# Patient Record
Sex: Female | Born: 1940 | Race: White | Hispanic: No | Smoking: Never smoker
Health system: Southern US, Community
[De-identification: ages and names within clinical notes are randomized; demographics above are authoritative.]

## PROBLEM LIST (undated history)

## (undated) DIAGNOSIS — R87619 Unspecified abnormal cytological findings in specimens from cervix uteri: Secondary | ICD-10-CM

## (undated) DIAGNOSIS — E785 Hyperlipidemia, unspecified: Secondary | ICD-10-CM

## (undated) DIAGNOSIS — K4090 Unilateral inguinal hernia, without obstruction or gangrene, not specified as recurrent: Secondary | ICD-10-CM

## (undated) DIAGNOSIS — F32A Depression, unspecified: Secondary | ICD-10-CM

## (undated) DIAGNOSIS — C801 Malignant (primary) neoplasm, unspecified: Secondary | ICD-10-CM

## (undated) DIAGNOSIS — D126 Benign neoplasm of colon, unspecified: Secondary | ICD-10-CM

## (undated) DIAGNOSIS — F319 Bipolar disorder, unspecified: Secondary | ICD-10-CM

## (undated) DIAGNOSIS — F419 Anxiety disorder, unspecified: Secondary | ICD-10-CM

## (undated) DIAGNOSIS — M1990 Unspecified osteoarthritis, unspecified site: Secondary | ICD-10-CM

## (undated) DIAGNOSIS — F329 Major depressive disorder, single episode, unspecified: Secondary | ICD-10-CM

## (undated) DIAGNOSIS — Z9889 Other specified postprocedural states: Secondary | ICD-10-CM

## (undated) DIAGNOSIS — S8290XA Unspecified fracture of unspecified lower leg, initial encounter for closed fracture: Secondary | ICD-10-CM

## (undated) DIAGNOSIS — G4733 Obstructive sleep apnea (adult) (pediatric): Secondary | ICD-10-CM

## (undated) HISTORY — DX: Depression, unspecified: F32.A

## (undated) HISTORY — DX: Major depressive disorder, single episode, unspecified: F32.9

## (undated) HISTORY — DX: Unilateral inguinal hernia, without obstruction or gangrene, not specified as recurrent: K40.90

## (undated) HISTORY — DX: Hyperlipidemia, unspecified: E78.5

## (undated) HISTORY — DX: Unspecified osteoarthritis, unspecified site: M19.90

## (undated) HISTORY — DX: Anxiety disorder, unspecified: F41.9

## (undated) HISTORY — DX: Bipolar disorder, unspecified: F31.9

## (undated) HISTORY — DX: Unspecified fracture of unspecified lower leg, initial encounter for closed fracture: S82.90XA

## (undated) HISTORY — DX: Malignant (primary) neoplasm, unspecified: C80.1

## (undated) HISTORY — DX: Unspecified abnormal cytological findings in specimens from cervix uteri: R87.619

## (undated) HISTORY — DX: Obstructive sleep apnea (adult) (pediatric): G47.33

## (undated) HISTORY — DX: Benign neoplasm of colon, unspecified: D12.6

## (undated) HISTORY — DX: Other specified postprocedural states: Z98.890

---

## 1977-04-07 DIAGNOSIS — C801 Malignant (primary) neoplasm, unspecified: Secondary | ICD-10-CM

## 1977-04-07 HISTORY — PX: RADICAL HYSTERECTOMY: SHX2283

## 1977-04-07 HISTORY — DX: Malignant (primary) neoplasm, unspecified: C80.1

## 1989-04-07 DIAGNOSIS — S8290XA Unspecified fracture of unspecified lower leg, initial encounter for closed fracture: Secondary | ICD-10-CM

## 1989-04-07 HISTORY — DX: Unspecified fracture of unspecified lower leg, initial encounter for closed fracture: S82.90XA

## 2000-03-13 ENCOUNTER — Ambulatory Visit (HOSPITAL_COMMUNITY): Admission: RE | Admit: 2000-03-13 | Discharge: 2000-03-13 | Payer: Self-pay | Admitting: Gastroenterology

## 2000-03-13 DIAGNOSIS — Z9889 Other specified postprocedural states: Secondary | ICD-10-CM

## 2000-03-13 HISTORY — DX: Other specified postprocedural states: Z98.890

## 2003-05-11 ENCOUNTER — Emergency Department (HOSPITAL_COMMUNITY): Admission: EM | Admit: 2003-05-11 | Discharge: 2003-05-11 | Payer: Self-pay | Admitting: Emergency Medicine

## 2003-07-29 ENCOUNTER — Emergency Department (HOSPITAL_COMMUNITY): Admission: EM | Admit: 2003-07-29 | Discharge: 2003-07-29 | Payer: Self-pay | Admitting: Family Medicine

## 2009-09-24 ENCOUNTER — Ambulatory Visit (HOSPITAL_BASED_OUTPATIENT_CLINIC_OR_DEPARTMENT_OTHER): Admission: RE | Admit: 2009-09-24 | Discharge: 2009-09-24 | Payer: Self-pay | Admitting: Family Medicine

## 2009-09-24 DIAGNOSIS — G4733 Obstructive sleep apnea (adult) (pediatric): Secondary | ICD-10-CM

## 2009-09-24 HISTORY — DX: Obstructive sleep apnea (adult) (pediatric): G47.33

## 2009-09-29 ENCOUNTER — Ambulatory Visit: Payer: Self-pay | Admitting: Internal Medicine

## 2010-05-25 ENCOUNTER — Inpatient Hospital Stay (INDEPENDENT_AMBULATORY_CARE_PROVIDER_SITE_OTHER)
Admission: RE | Admit: 2010-05-25 | Discharge: 2010-05-25 | Disposition: A | Discharge status: Home / Self Care | Payer: Medicare Other | Source: Ambulatory Visit | Attending: Family Medicine | Admitting: Family Medicine

## 2010-05-25 DIAGNOSIS — E876 Hypokalemia: Secondary | ICD-10-CM

## 2010-05-25 DIAGNOSIS — I10 Essential (primary) hypertension: Secondary | ICD-10-CM

## 2010-05-25 DIAGNOSIS — T50995A Adverse effect of other drugs, medicaments and biological substances, initial encounter: Secondary | ICD-10-CM

## 2010-05-25 DIAGNOSIS — N289 Disorder of kidney and ureter, unspecified: Secondary | ICD-10-CM

## 2010-05-25 LAB — POCT URINALYSIS DIPSTICK
Nitrite: NEGATIVE
Protein, ur: 100 mg/dL — AB
Specific Gravity, Urine: 1.015 (ref 1.005–1.030)
Urine Glucose, Fasting: 100 mg/dL — AB
Urobilinogen, UA: 1 mg/dL (ref 0.0–1.0)
pH: 5 (ref 5.0–8.0)

## 2010-05-25 LAB — POCT I-STAT, CHEM 8
BUN: 17 mg/dL (ref 6–23)
Calcium, Ion: 1.18 mmol/L (ref 1.12–1.32)
Chloride: 105 mEq/L (ref 96–112)
Creatinine, Ser: 1.4 mg/dL — ABNORMAL HIGH (ref 0.4–1.2)
Glucose, Bld: 129 mg/dL — ABNORMAL HIGH (ref 70–99)
HCT: 41 % (ref 36.0–46.0)
Hemoglobin: 13.9 g/dL (ref 12.0–15.0)
Potassium: 3.2 mEq/L — ABNORMAL LOW (ref 3.5–5.1)
Sodium: 134 mEq/L — ABNORMAL LOW (ref 135–145)
TCO2: 19 mmol/L (ref 0–100)

## 2010-07-08 ENCOUNTER — Ambulatory Visit (INDEPENDENT_AMBULATORY_CARE_PROVIDER_SITE_OTHER): Payer: Medicare Other | Admitting: Internal Medicine

## 2010-07-08 ENCOUNTER — Encounter: Payer: Self-pay | Admitting: Internal Medicine

## 2010-07-08 DIAGNOSIS — E785 Hyperlipidemia, unspecified: Secondary | ICD-10-CM | POA: Insufficient documentation

## 2010-07-08 DIAGNOSIS — R109 Unspecified abdominal pain: Secondary | ICD-10-CM

## 2010-07-08 DIAGNOSIS — G4733 Obstructive sleep apnea (adult) (pediatric): Secondary | ICD-10-CM

## 2010-07-08 DIAGNOSIS — M1990 Unspecified osteoarthritis, unspecified site: Secondary | ICD-10-CM

## 2010-07-08 DIAGNOSIS — E039 Hypothyroidism, unspecified: Secondary | ICD-10-CM

## 2010-07-08 DIAGNOSIS — F319 Bipolar disorder, unspecified: Secondary | ICD-10-CM

## 2010-07-08 MED ORDER — DICYCLOMINE HCL 10 MG PO CAPS: 10.0000 mg | ORAL_CAPSULE | Freq: Four times a day (QID) | ORAL | Status: DC

## 2010-07-08 NOTE — Assessment & Plan Note (Addendum)
Ill-defined abdominal pain for 2 years, recent CT of the abdomen negative except for non-specific mesenteric lymphadenopathies. Chart is reviewed, had a normal colonoscopy in 2001. IBS? Plan: Trial with bentyl GI referal, for pain eval and consideration of a cscope .... +FH colon ca (GM per Pt) Reassess in one month

## 2010-07-08 NOTE — Patient Instructions (Signed)
Try Bentyl as needed for abdominal pain Align, it is a over-the-counter "good bacteria." One daily for a month Get records and labs from your  previous physician

## 2010-07-08 NOTE — Progress Notes (Signed)
  Subjective:    Patient ID: Laura Drake, female    DOB: [birth year], 70 y.o.   MRN: [id number]  HPI New pt, to get stablish  Her main concern today is a 2 year history of an ill-defined lower abdominal discomfort, on and off. She saw her urologist for that because she also had a UTI, CT of the abdomen few days ago showed inguinal hernia, some nonspecific mesenteric lymphadenopathies but otherwise no explanation for her discomfort. She also has halythosis, status post evaluation by her dentist, no dental etiology  found  Past Medical History  Diagnosis Date  . DJD (degenerative joint disease)        . Depression     h/o suicidal attempt 58  . Hyperlipidemia   . Bipolar affective disorder     dx at age 35   . Glaucoma   . Hypothyroidism   . Hernia, inguinal     per CT 3-12  . S/P colonoscopy 03-13-2000    hemorrhoids, tortous sigmoid, Dr Ewing Schlein  . OSA (obstructive sleep apnea) 09-24-09    + sleep study, "moderate"   Past Surgical History  Procedure Date  . Radical hysterectomy 1999   History   Social History  . Marital Status: Divorced    Spouse Name: N/A    Number of Children: N/A  . Years of Education: N/A   Occupational History  . Retired    Social History Main Topics  . Smoking status: Never Smoker   . Smokeless tobacco: Never Used  . Alcohol Use: 0.0 oz/week    0.2 drink(s) per week     1/monthly- wine or whisky sour  . Drug Use: Not on file  . Sexually Active: Not on file   Other Topics Concern  . Not on file   Social History Narrative   WidowChildren x 3 Daughter is [name] , one of my ptLives by herselfRetired      Review of Systems No fevers No nausea, vomiting, diarrhea No blood in the stools Nor recent weight loss     Objective:   Physical Exam  Constitutional: She appears well-developed and well-nourished.  Cardiovascular: Normal rate, regular rhythm and normal heart sounds.   Pulmonary/Chest: Effort normal and breath sounds  normal.  Abdominal: Soft. Bowel sounds are normal. She exhibits no distension. There is no tenderness. There is no rebound and no guarding.  Musculoskeletal: She exhibits no edema.  Psychiatric: She has a normal mood and affect. Her behavior is normal. Judgment and thought content normal.          Assessment & Plan:

## 2010-07-08 NOTE — Assessment & Plan Note (Signed)
Recently had severe knee pain, status post a local injection which helped.

## 2010-08-23 ENCOUNTER — Other Ambulatory Visit: Payer: Self-pay | Admitting: Internal Medicine

## 2010-08-23 NOTE — Procedures (Signed)
[facility]  Patient:    Laura Drake, Laura Drake                       MRN: [id number] Proc. Date: 03/13/00 Adm. Date:  [id number] Attending:  Nelda Marseille CC:         Feliciana Rossetti, M.D.   Procedure Report  PROCEDURE:  Colonoscopy.  SURGEON:  Petra Kuba, M.D.  INDICATIONS:  Family history of colon cancer, chronic constipation, and due for colonic screening.  Consent was signed after risks, benefits, methods, and options were thoroughly discussed in the office.  MEDICINES USED:  Demerol 70 and Versed 7.  DESCRIPTION OF PROCEDURE:  Rectal inspection was pertinent for external hemorrhoids.  Digital exam was negative.  The video colonoscope was inserted and with mild difficulty due to a tortuous sigmoid and tortuous ______ were able to be advanced to the cecum.  This did require rolling her on her back to multiple various abdominal pressures.  The cecum was identified by the appendiceal orifice and the ileocecal valve.  No obvious abnormality was seen on insertion.  Lots of washing and suctioning was done on insertion as well as on withdrawal.  Prep in the cecum and the majority of the ascending was only poor due to stool being stuck to the wall and unable to be washed and suctioned off.  The rest of the prep was adequate.  No obvious abnormality was seen.  The scope was slowly withdrawn.  When we fell back around a tortuous loop, we did try to readvance around those curves.  No polypoid lesions, masses, or other abnormalities were seen.  Once back in the rectum, the scope was retroflexed and pertinent for some internal hemorrhoids.  Anorectal pull through confirmed the above.  The scope was straightened, air was suctioned, and the scope removed.  The patient tolerated the procedure adequately and there was no obvious or immediate complication.  ENDOSCOPIC DIAGNOSES: 1. Internal and external hemorrhoids, small. 2. Tortuous sigmoid on flexion. 3.  Adequate preparation on the left side, but a poor preparation in the cecum    and the ascending. 4. Otherwise within normal limits to the cecum.  PLAN:  Yearly rectals and guaiacs per Dr. Quintella Reichert.  Will _____ the crystalloids q.d., prescription written.  GI follow up p.r.n.  Otherwise, repeat screening in five years unless needed sooner p.r.n., and would use GoLYTELY on repeat screening. DD:  03/13/00 TD:  03/14/00 Job: [id number]

## 2010-08-26 NOTE — Telephone Encounter (Signed)
Ok 30, no RF Needs OV

## 2010-10-28 ENCOUNTER — Encounter: Payer: Self-pay | Admitting: Internal Medicine

## 2010-10-28 ENCOUNTER — Ambulatory Visit (INDEPENDENT_AMBULATORY_CARE_PROVIDER_SITE_OTHER): Payer: Medicare Other | Admitting: Internal Medicine

## 2010-10-28 DIAGNOSIS — E039 Hypothyroidism, unspecified: Secondary | ICD-10-CM

## 2010-10-28 DIAGNOSIS — R739 Hyperglycemia, unspecified: Secondary | ICD-10-CM

## 2010-10-28 DIAGNOSIS — F319 Bipolar disorder, unspecified: Secondary | ICD-10-CM

## 2010-10-28 DIAGNOSIS — R7309 Other abnormal glucose: Secondary | ICD-10-CM

## 2010-10-28 DIAGNOSIS — E785 Hyperlipidemia, unspecified: Secondary | ICD-10-CM

## 2010-10-28 NOTE — Patient Instructions (Signed)
Please come back fasting tomorrow: Hemoglobin A1c, BMP--- dx  hyperglycemia Fasting lipid profile --- dx hyperlipidemia Lithium level --- dx bipolar TSH --- dx hypothyroidism Please reschedule your appointment to see the stomach doctor. Please call today or tomorrow Dr. Madaline Guthrie and let them know that your depression is not well controlled. If your suicidal thoughts are severe, you need to call them immediately

## 2010-10-28 NOTE — Assessment & Plan Note (Signed)
On diet only, labs 

## 2010-10-28 NOTE — Assessment & Plan Note (Addendum)
reports a history of hyperglycemia, check A1c and a BMP

## 2010-10-28 NOTE — Assessment & Plan Note (Signed)
Due for labs

## 2010-10-28 NOTE — Assessment & Plan Note (Addendum)
Will check a lithium level fax to Dr. Madaline Guthrie. I'm somehow concerned about his suicidal thought she had from time to time, reports that she had that for years, they are not severe, she has no plans.

## 2010-10-28 NOTE — Progress Notes (Signed)
  Subjective:    Patient ID: Laura Drake, female    DOB: 1940-04-22, 70 y.o.   MRN: [id number]  HPI Followup from previous visit.  Past Medical History  Diagnosis Date  . DJD (degenerative joint disease)        . Depression     h/o suicidal attempt 71  . Hyperlipidemia   . Bipolar affective disorder     dx at age 34   . Glaucoma   . Hypothyroidism   . Hernia, inguinal     per CT 3-12  . S/P colonoscopy 03-13-2000    hemorrhoids, tortous sigmoid, Dr Ewing Schlein  . OSA (obstructive sleep apnea) 09-24-09    + sleep study, "moderate"   Past Surgical History  Procedure Date  . Radical hysterectomy 1999     Review of Systems abdominal pain--she took align and bentyl as prescribed. While on bentyl she thinks the pain was worse. She had to cancel her GI appointment but plans to reschedule. Likes the following labs done: FLP A1C b/c  in the past she was told she had hyperglycemia, never required medication. Also needs her lithium level checked Her bipolar sx are not as well control as she would like, she has been feeling more depressed in the last few months. When asked about suicide, the thought has crossed her mind, reports that is not unusual for her but she wouldn't do it because she has 3 g-kids.     Objective:   Physical Exam  Constitutional: She is oriented to person, place, and time. She appears well-developed and well-nourished. No distress.  Abdominal: Soft. Bowel sounds are normal. She exhibits no distension. There is no tenderness. There is no rebound and no guarding.  Neurological: She is alert and oriented to person, place, and time.  Skin: She is not diaphoretic.  Psychiatric: She has a normal mood and affect. Her behavior is normal. Judgment and thought content normal.          Assessment & Plan:

## 2010-10-29 ENCOUNTER — Other Ambulatory Visit (INDEPENDENT_AMBULATORY_CARE_PROVIDER_SITE_OTHER): Payer: Medicare Other

## 2010-10-29 DIAGNOSIS — E119 Type 2 diabetes mellitus without complications: Secondary | ICD-10-CM

## 2010-10-29 DIAGNOSIS — E785 Hyperlipidemia, unspecified: Secondary | ICD-10-CM

## 2010-10-29 DIAGNOSIS — E039 Hypothyroidism, unspecified: Secondary | ICD-10-CM

## 2010-10-29 LAB — BASIC METABOLIC PANEL
BUN: 14 mg/dL (ref 6–23)
CO2: 23 mEq/L (ref 19–32)
Calcium: 9.1 mg/dL (ref 8.4–10.5)
Chloride: 111 mEq/L (ref 96–112)
Creatinine, Ser: 1 mg/dL (ref 0.4–1.2)
GFR: 59.24 mL/min — ABNORMAL LOW (ref >60.00)
Glucose, Bld: 118 mg/dL — ABNORMAL HIGH (ref 70–99)
Potassium: 4.1 mEq/L (ref 3.5–5.1)
Sodium: 140 mEq/L (ref 135–145)

## 2010-10-29 LAB — LIPID PANEL
Cholesterol: 192 mg/dL (ref 0–200)
HDL: 54.5 mg/dL (ref >39.00)
LDL Cholesterol: 105 mg/dL — ABNORMAL HIGH (ref 0–99)
Total CHOL/HDL Ratio: 4
Triglycerides: 163 mg/dL — ABNORMAL HIGH (ref 0.0–149.0)
VLDL: 32.6 mg/dL (ref 0.0–40.0)

## 2010-10-29 LAB — HEMOGLOBIN A1C: Hgb A1c MFr Bld: 6.2 % (ref 4.6–6.5)

## 2010-10-29 LAB — TSH: TSH: 1.65 u[IU]/mL (ref 0.35–5.50)

## 2010-10-29 NOTE — Progress Notes (Signed)
Labs only Labs only  

## 2010-10-30 LAB — LITHIUM LEVEL: Lithium Lvl: 0.58 mEq/L — ABNORMAL LOW (ref 0.80–1.40)

## 2010-10-31 ENCOUNTER — Telehealth: Payer: Self-pay | Admitting: *Deleted

## 2010-10-31 NOTE — Telephone Encounter (Signed)
Message copied by Leanne Lovely on Thu Oct 31, 2010  3:34 PM ------      Message from: Willow Ora E      Created: Thu Oct 31, 2010  3:27 PM       Advise patient called      She does have hyperglycemia (borderline diabetes) and the triglycerides are slightly elevated. Both conditions are treated with a healthier diet and daily exercise.      Please fax these results to her psychiatrist Dr. Madaline Guthrie. Ask the patient for the phone number.      Also advise patient to keep her appointment in 4 months

## 2010-10-31 NOTE — Telephone Encounter (Signed)
Pt aware of labs and copy faxed to psych

## 2010-10-31 NOTE — Telephone Encounter (Signed)
Message left for patient to return my call.  

## 2011-01-17 ENCOUNTER — Ambulatory Visit: Payer: Medicare Other | Admitting: Internal Medicine

## 2011-01-17 ENCOUNTER — Encounter: Payer: Self-pay | Admitting: Internal Medicine

## 2011-01-17 ENCOUNTER — Ambulatory Visit (INDEPENDENT_AMBULATORY_CARE_PROVIDER_SITE_OTHER): Payer: Medicare Other | Admitting: Internal Medicine

## 2011-01-17 VITALS — BP 122/72 | HR 85 | Temp 98.3°F | Resp 16 | Ht 63.25 in | Wt 185.2 lb

## 2011-01-17 DIAGNOSIS — Z23 Encounter for immunization: Secondary | ICD-10-CM

## 2011-01-17 DIAGNOSIS — M1710 Unilateral primary osteoarthritis, unspecified knee: Secondary | ICD-10-CM

## 2011-01-17 DIAGNOSIS — R7309 Other abnormal glucose: Secondary | ICD-10-CM

## 2011-01-17 DIAGNOSIS — R5381 Other malaise: Secondary | ICD-10-CM

## 2011-01-17 DIAGNOSIS — M1990 Unspecified osteoarthritis, unspecified site: Secondary | ICD-10-CM

## 2011-01-17 DIAGNOSIS — R5383 Other fatigue: Secondary | ICD-10-CM

## 2011-01-17 DIAGNOSIS — Z0000 Encounter for general adult medical examination without abnormal findings: Secondary | ICD-10-CM

## 2011-01-17 DIAGNOSIS — E039 Hypothyroidism, unspecified: Secondary | ICD-10-CM

## 2011-01-17 DIAGNOSIS — IMO0002 Reserved for concepts with insufficient information to code with codable children: Secondary | ICD-10-CM

## 2011-01-17 DIAGNOSIS — R739 Hyperglycemia, unspecified: Secondary | ICD-10-CM

## 2011-01-17 DIAGNOSIS — R8290 Unspecified abnormal findings in urine: Secondary | ICD-10-CM

## 2011-01-17 DIAGNOSIS — J069 Acute upper respiratory infection, unspecified: Secondary | ICD-10-CM

## 2011-01-17 LAB — POCT URINALYSIS DIPSTICK
Blood, UA: NEGATIVE
Glucose, UA: NEGATIVE
Leukocytes, UA: NEGATIVE
Nitrite, UA: NEGATIVE
Spec Grav, UA: 1.02
Urobilinogen, UA: 0.2
pH, UA: 6

## 2011-01-17 MED ORDER — AZITHROMYCIN 250 MG PO TABS: ORAL_TABLET | ORAL | Status: AC

## 2011-01-17 NOTE — Assessment & Plan Note (Signed)
Patient had several other issues today, she's not fasting. Plan: Reschedule CPX Flu shot today

## 2011-01-17 NOTE — Assessment & Plan Note (Signed)
URI versus allergies, she is nontoxic. See instructions

## 2011-01-17 NOTE — Assessment & Plan Note (Signed)
Hypoglycemia?. Her only symptom was fatigue, she's not taking any diabetes medicine, I doubt she has hypoglycemia. Patient to let me know if symptoms are recurrent

## 2011-01-17 NOTE — Assessment & Plan Note (Addendum)
Exacerbation of knee DJD. Recommend Tylenol, ice and continue using a brace. If not better in few days, recommend to see Dr. Penni Bombard

## 2011-01-17 NOTE — Progress Notes (Signed)
  Subjective:    Patient ID: Laura Drake, female    DOB: 11-18-1940, 70 y.o.   MRN: [id number]  HPI Here with her daughter, supposedly for a complete physical exam has several other issues, we'll do a physical exam next visit. Developed postnasal dripping, cough and raspy voice 2 days ago after she returned from [place]. History of diabetes, reports " low blood sugar" while in [place], did not check her  blood sugar but felt weak and improved after it she ate. Complains of knee pain since she came back from [place], left-sided Complains of bilateral ankle edema since she came back from [place], overall improving at this time. UTI? Some dysuria   Past Medical History  Diagnosis Date  . DJD (degenerative joint disease)   . Depression     h/o suicidal attempt 53  . Hyperlipidemia   . Bipolar affective disorder     dx at age 44   . Glaucoma   . Hypothyroidism   . Hernia, inguinal     per CT 3-12  . S/P colonoscopy 03-13-2000    hemorrhoids, tortous sigmoid, Dr Ewing Schlein  . OSA (obstructive sleep apnea) 09-24-09    + sleep study, "moderate"  . Fracture of leg 1991    L leg, cast x 6 months    Past Surgical History  Procedure Date  . Radical hysterectomy 1999    Review of Systems Denies any chest pain, shortness of breath or palpitations. No fever or chills, no sputum production. No sore throat No chest congestion or wheezing.     Objective:   Physical Exam  Constitutional: She appears well-developed and well-nourished. No distress.  HENT:  Head: Normocephalic and atraumatic.  Right Ear: External ear normal.  Left Ear: External ear normal.  Nose: Nose normal.  Mouth/Throat: No oropharyngeal exudate.       Face symmetric, nontender to palpation  Cardiovascular: Normal rate, regular rhythm and normal heart sounds.   Pulmonary/Chest: Effort normal and breath sounds normal. No respiratory distress. She has no wheezes. She has no rales.  Abdominal: Soft. She exhibits no distension.  There is no tenderness. There is no rebound and no guarding.  Musculoskeletal:       Lower extremities without edema, canals are symmetric and nontender. Both knees had changes consistent with DJD, left knee with a small amount of effusion by physical exam, slightly warm, no redness. Range of motion normal bilaterally.  Skin: She is not diaphoretic.  Psychiatric:       Alert, oriented x3, slightly "hyper" (daughter reports patient has been slightly manic lately)          Assessment & Plan:  UTI? Will send a UCX

## 2011-01-17 NOTE — Assessment & Plan Note (Signed)
Last TSH ok 

## 2011-01-17 NOTE — Patient Instructions (Addendum)
Ice , rest, continue using the knee  brace and take Tylenol for pain, see your orthopedic doctor if the knees not improving soon. Rest, fluids , tylenol For cough, take Mucinex DM twice a day as needed , saline spray, claritine If not better in a few days, start Zithromax, see prescription Call if no better in 2 weeks Call anytime if the symptoms are severe, you have high fever, short of breath, persistent chest pain

## 2011-01-22 LAB — CULTURE, URINE COMPREHENSIVE: Colony Count: 55000

## 2011-01-24 ENCOUNTER — Telehealth: Payer: Self-pay | Admitting: *Deleted

## 2011-01-24 MED ORDER — AMOXICILLIN 500 MG PO CAPS: 1000.0000 mg | ORAL_CAPSULE | Freq: Two times a day (BID) | ORAL | Status: AC

## 2011-01-24 NOTE — Telephone Encounter (Signed)
Patient informed Rx sent to pharmacy.

## 2011-01-24 NOTE — Telephone Encounter (Signed)
Message copied by Regis Bill on Fri Jan 24, 2011  4:31 PM ------      Message from: Laura Drake      Created: Thu Jan 23, 2011  5:27 PM       Patient complain of dysuria thus will treat.      Call in amoxicillin 500 mg 2 by mouth twice a day for 5 days #20, no RF

## 2011-02-21 ENCOUNTER — Telehealth: Payer: Self-pay | Admitting: Internal Medicine

## 2011-02-21 NOTE — Telephone Encounter (Signed)
Pt was suppose to schedule a CPX with Dr. Drue Novel for this month. She should come in fasting and can discuss the GI referral at that time. Please call pt to schedule physical

## 2011-02-21 NOTE — Telephone Encounter (Signed)
Patient discussed scheduling a colonoscopy with dr Drue Novel - she was going to schedule but the dr retired - wants dr Drue Novel to refer to [facility]

## 2011-02-25 NOTE — Telephone Encounter (Signed)
appt for  scheduled [id number] --- cpx scheduled [phone]

## 2011-03-07 ENCOUNTER — Ambulatory Visit: Payer: Medicare Other | Admitting: Internal Medicine

## 2011-03-19 ENCOUNTER — Encounter: Payer: Self-pay | Admitting: Internal Medicine

## 2011-03-19 ENCOUNTER — Ambulatory Visit (INDEPENDENT_AMBULATORY_CARE_PROVIDER_SITE_OTHER): Payer: Medicare Other | Admitting: Internal Medicine

## 2011-03-19 DIAGNOSIS — M1990 Unspecified osteoarthritis, unspecified site: Secondary | ICD-10-CM

## 2011-03-19 DIAGNOSIS — R739 Hyperglycemia, unspecified: Secondary | ICD-10-CM

## 2011-03-19 DIAGNOSIS — F319 Bipolar disorder, unspecified: Secondary | ICD-10-CM

## 2011-03-19 DIAGNOSIS — E039 Hypothyroidism, unspecified: Secondary | ICD-10-CM

## 2011-03-19 DIAGNOSIS — E785 Hyperlipidemia, unspecified: Secondary | ICD-10-CM

## 2011-03-19 DIAGNOSIS — Z0000 Encounter for general adult medical examination without abnormal findings: Secondary | ICD-10-CM

## 2011-03-19 DIAGNOSIS — R7309 Other abnormal glucose: Secondary | ICD-10-CM

## 2011-03-19 DIAGNOSIS — Z13820 Encounter for screening for osteoporosis: Secondary | ICD-10-CM

## 2011-03-19 NOTE — Assessment & Plan Note (Addendum)
Going through some depression, no suicidal, reports she has these episodes on and on for many years. I recommended to call her psychiatrist.

## 2011-03-19 NOTE — Assessment & Plan Note (Signed)
On diet only, labs 

## 2011-03-19 NOTE — Progress Notes (Signed)
Subjective:    Patient ID: Laura Drake, female    DOB: 1941/03/07, 70 y.o.   MRN: [id number]  HPI Here for Medicare AWV: 1. Risk factors based on Past M, S, F history: reviewed 2. Physical Activities: very active at home most of the time    3. Depression/mood: currently going to a depress state d/t bipolar, no suicidal  4. Hearing: reports is normal, no tinnitus   5. ADL's: independent, still drives   6. Fall Risk: no recent falls  7. home Safety: does feel safe at home  8. Height, weight, &visual acuity: see VS, h/o glaucoma, sees eye doctor regularly  9. Counseling: provided 10. Labs ordered based on risk factors: if needed  11. Referral Coordination: if needed 12.  Care Plan, see assessment and plan  13.   Cognitive Assessment: motor skills and cognition are appropriate   In addition, today we discussed the following: Saw gyn, Dr Laura Drake recently, to have a u/s ; they did a stool sample  Colonoscopy-- needs referral Bipolar-- per psych, was rec to increase lithium dose.  Hypothyroid-- good med compliance  DJD-- on ibuprofen, 4 OTCs a day, relatively well control; no apparent GI s/e   Past Medical History  Diagnosis Date  . DJD (degenerative joint disease)   . Depression     h/o suicidal attempt 5  . Hyperlipidemia   . Bipolar affective disorder     dx at age 73   . Glaucoma   . Hypothyroidism   . Hernia, inguinal     per CT 3-12  . S/P colonoscopy 03-13-2000    hemorrhoids, tortous sigmoid, Dr Laura Drake  . OSA (obstructive sleep apnea) 09-24-09    + sleep study, "moderate"  . Fracture of leg 1991    L leg, cast x 6 months     Past Surgical History  Procedure Date  . Radical hysterectomy 1999   History   Social History  . Marital Status: Divorced    Spouse Name: N/A    Number of Children: N/A  . Years of Education: N/A   Occupational History  . Retired    Social History Main Topics  . Smoking status: Never Smoker   . Smokeless tobacco: Never Used  .  Alcohol Use: 0.0 oz/week    0.2 drink(s) per week     1/monthly- wine or whisky sour  . Drug Use: Not on file  . Sexually Active: Not on file   Other Topics Concern  . Not on file   Social History Narrative   WidowChildren x 3 Daughter is Laura Drake , one of my ptLives by herselfRetired    Family History  Problem Relation Age of Onset  . Bipolar disorder Father   . Diabetes Sister   . Colon cancer Paternal Grandmother   . Parkinsonism Mother    Review of Systems  Constitutional: Negative for fever.  Cardiovascular: Negative for chest pain and leg swelling.  Gastrointestinal: Negative for abdominal pain and blood in stool.       Chronic constipation since childhood   Genitourinary: Negative for hematuria and difficulty urinating.       Objective:   Physical Exam  Constitutional: She is oriented to person, place, and time. She appears well-developed. No distress.  HENT:  Head: Normocephalic and atraumatic.  Neck: No thyromegaly present.       Normal carotid pulse  Cardiovascular: Normal rate, regular rhythm and normal heart sounds.   No murmur heard. Pulmonary/Chest: Effort normal and breath  sounds normal. No respiratory distress. She has no wheezes. She has no rales.  Abdominal: Soft. Bowel sounds are normal. She exhibits no distension. There is no tenderness. There is no rebound and no guarding.       No bruit   Musculoskeletal: She exhibits no edema.  Neurological: She is alert and oriented to person, place, and time.  Skin: Skin is warm and dry. She is not diaphoretic.  Psychiatric:       She gets tearful at times, she is however coherent and cooperative            Assessment & Plan:

## 2011-03-19 NOTE — Assessment & Plan Note (Signed)
takes 4 ibuprofen a day, no apparent side effects. No change for now. Warned about PUD sx

## 2011-03-19 NOTE — Patient Instructions (Addendum)
Please call your psychiatrist if your symptoms get worse or you are not better in 2 days Continue watching your diet and try to exercise daily. Ibuprofen make cause stomach irritation, nausea, abdominal pain.If you  have any of those symptoms----> stop ibuprofen and let me know. You may like to tr Tylenol for arthritis

## 2011-03-19 NOTE — Assessment & Plan Note (Addendum)
Td 09 Had a flu shot already Unclear if she had Pneumonia shot already, reassess next year Shingles shot discussed  Cscope 2001, Dr Ewing Schlein, likes to transfer to [place]: referral done  Just  met w/ her new  Gynecologist, they did a breast exam Will refer for a MMG Last DEXA ~ 2002, normal----will order one today   Diet, exercise discussed  EKG (baseline) normal sinus rhythm, no acute changes

## 2011-03-19 NOTE — Assessment & Plan Note (Signed)
Labs

## 2011-03-19 NOTE — Assessment & Plan Note (Addendum)
A1c 6.2, 10-2010 Labs Diet and exercise discussed

## 2011-03-20 LAB — CBC WITH DIFFERENTIAL/PLATELET
Basophils Absolute: 0.1 10*3/uL (ref 0.0–0.1)
Basophils Relative: 0.5 % (ref 0.0–3.0)
Eosinophils Absolute: 0.2 10*3/uL (ref 0.0–0.7)
Eosinophils Relative: 1.8 % (ref 0.0–5.0)
HCT: 41.8 % (ref 36.0–46.0)
Hemoglobin: 14.2 g/dL (ref 12.0–15.0)
Lymphocytes Relative: 38.9 % (ref 12.0–46.0)
Lymphs Abs: 4 10*3/uL (ref 0.7–4.0)
MCHC: 34 g/dL (ref 30.0–36.0)
MCV: 92 fl (ref 78.0–100.0)
Monocytes Absolute: 1 10*3/uL (ref 0.1–1.0)
Monocytes Relative: 9.3 % (ref 3.0–12.0)
Neutro Abs: 5.1 10*3/uL (ref 1.4–7.7)
Neutrophils Relative %: 49.5 % (ref 43.0–77.0)
Platelets: 344 10*3/uL (ref 150.0–400.0)
RBC: 4.55 Mil/uL (ref 3.87–5.11)
RDW: 14.6 % (ref 11.5–14.6)
WBC: 10.3 10*3/uL (ref 4.5–10.5)

## 2011-03-20 LAB — TSH: TSH: 2.96 u[IU]/mL (ref 0.35–5.50)

## 2011-03-20 LAB — LIPID PANEL
Cholesterol: 204 mg/dL — ABNORMAL HIGH (ref 0–200)
HDL: 63.1 mg/dL (ref >39.00)
Total CHOL/HDL Ratio: 3
Triglycerides: 138 mg/dL (ref 0.0–149.0)
VLDL: 27.6 mg/dL (ref 0.0–40.0)

## 2011-03-20 LAB — LDL CHOLESTEROL, DIRECT: Direct LDL: 109.6 mg/dL

## 2011-03-20 LAB — HEMOGLOBIN A1C: Hgb A1c MFr Bld: 6.1 % (ref 4.6–6.5)

## 2011-03-21 ENCOUNTER — Encounter: Payer: Self-pay | Admitting: Internal Medicine

## 2011-03-24 ENCOUNTER — Ambulatory Visit (AMBULATORY_SURGERY_CENTER): Payer: Medicare Other | Admitting: *Deleted

## 2011-03-24 ENCOUNTER — Encounter: Payer: Self-pay | Admitting: Internal Medicine

## 2011-03-24 VITALS — Wt 180.0 lb

## 2011-03-24 DIAGNOSIS — Z1211 Encounter for screening for malignant neoplasm of colon: Secondary | ICD-10-CM

## 2011-03-24 NOTE — Progress Notes (Signed)
Pt here today for PV for screening colonoscopy.  Last colonoscopy 2001 w/ Dr. Ewing Schlein.  In PV pt says she is having RUQ and LUQ pain. This pain is sometimes severe, rating 8 on pain scale when it occurs.  She has not had this pain recently.  She also says she has severe constipation, using Miralax 4 times a week.  Pt would like to make an appointment to see Dr. Juanda Chance in the office.  Colonoscopy scheduled for 12/21 cancelled and new patient appointment scheduled for 04/30/2011.  Pt instructed to call office if she has any problems with pain before scheduled office visit. Ezra Sites

## 2011-03-28 ENCOUNTER — Other Ambulatory Visit: Payer: Medicare Other | Admitting: Internal Medicine

## 2011-04-08 DIAGNOSIS — D126 Benign neoplasm of colon, unspecified: Secondary | ICD-10-CM

## 2011-04-08 HISTORY — DX: Benign neoplasm of colon, unspecified: D12.6

## 2011-04-17 ENCOUNTER — Encounter: Payer: Self-pay | Admitting: *Deleted

## 2011-04-30 ENCOUNTER — Ambulatory Visit (INDEPENDENT_AMBULATORY_CARE_PROVIDER_SITE_OTHER): Payer: Medicare Other | Admitting: Internal Medicine

## 2011-04-30 ENCOUNTER — Encounter: Payer: Self-pay | Admitting: Internal Medicine

## 2011-04-30 DIAGNOSIS — R1032 Left lower quadrant pain: Secondary | ICD-10-CM

## 2011-04-30 DIAGNOSIS — R933 Abnormal findings on diagnostic imaging of other parts of digestive tract: Secondary | ICD-10-CM

## 2011-04-30 MED ORDER — PEG-KCL-NACL-NASULF-NA ASC-C 100 G PO SOLR: 1.0000 | Freq: Once | ORAL | Status: DC

## 2011-04-30 NOTE — Progress Notes (Signed)
Laura Drake 1940-10-20 MRN [id number]     History of Present Illness:  This is a 71 year old white female with chronic left lower quadrant abdominal pain localized anteriorly. A CT scan of the abdomen in March 2012 showed numerous mesenteric lymph nodes of normal size. She had a left inguinal hernia with fat content. The pain comes and goes and becomes rather intense at times. She is chronically constipated and takes MiraLax 17 g 4 times a week. She has been evaluated by a gynecologist and by a urologist for this pain. No specific reason has been found. She had a colonoscopy by Dr.Magod in 2001 and was told to have a torturous colon. She has a diagnosis of bipolar disorder treated  with lithium. Her paternal grandmother had colon cancer.    Past Medical History  Diagnosis Date  . DJD (degenerative joint disease)   . Depression     h/o suicidal attempt 89  . Hyperlipidemia   . Bipolar affective disorder     dx at age 21   . Glaucoma   . Hypothyroidism   . Hernia, inguinal     per CT 3-12  . S/P colonoscopy 03-13-2000    hemorrhoids, tortous sigmoid, Dr Ewing Schlein  . OSA (obstructive sleep apnea) 09-24-09    + sleep study, "moderate"  . Fracture of leg 1991    L leg, cast x 6 months   . Hemorrhoids    Past Surgical History  Procedure Date  . Radical hysterectomy 1979    reports that she has never smoked. She has never used smokeless tobacco. She reports that she drinks alcohol. She reports that she does not use illicit drugs. family history includes Bipolar disorder in her father; Colon cancer in her paternal grandmother; Diabetes in her sister; and Parkinsonism in her mother. Allergies  Allergen Reactions  . Other Swelling    Shellfish  . Sulfa Antibiotics         Review of Systems:He denies dysphagia, odynophagia or shortness of breath or chest pain  The remainder of the 10 point ROS is negative except as outlined in H&P   Physical Exam: General appearance  Well  developed, in no distress. Eyes- non icteric. HEENT nontraumatic, normocephalic. Mouth no lesions, tongue papillated, no cheilosis. Neck supple without adenopathy, thyroid not enlarged, no carotid bruits, no JVD. Lungs Clear to auscultation bilaterally. Cor normal S1, normal S2, regular rhythm, no murmur,  quiet precordium. Abdomen: Soft abdomen with normal active bowel sounds. Tenderness along sigmoid colon. No fullness or rebound. No abdominal bruit. Liver edge at costal margin.  Rectal:Normal perianal area. Normal rectal sphincter tone. No stool in the ampulla  Extremities no pedal edema. Skin no lesions. Neurological alert and oriented x 3. Psychological normal mood and affect.  Assessment and Plan:  Problem #1 Chronic left lower quadrant abdominal pain most likely related to her tortorous left colon. This is possibly due to significant diverticulosis of the left colon or even a partial obstruction. This needs to be further evaluated.Her recent pelvic ultrasound was normal. Her last colonoscopy in 2001 confirmed a tortuous colon. We will proceed with a colonoscopy with propofol sedation. I anticipate it may be difficult to negotiate through the left colon. I have told her to use MiraLax and stool softeners to produce at least one bowel movement a day. We may have more recommendations after her colonoscopy.  04/30/2011 [name]

## 2011-04-30 NOTE — Patient Instructions (Addendum)
You have been scheduled for a Colonoscopy with propofol. See separate instructions.  Pick up your prep kit from your pharmacy.  cc: Dr Drue Novel

## 2011-05-01 ENCOUNTER — Telehealth: Payer: Self-pay | Admitting: Internal Medicine

## 2011-05-01 ENCOUNTER — Telehealth: Payer: Self-pay | Admitting: *Deleted

## 2011-05-01 NOTE — Telephone Encounter (Signed)
Patient states that she has been taking over the counter medications for constipation (Miralax and stool softener) and she is in severe pain. She would like to know if there are any medications that we can give her for discomfort while she is awaiting her procedure on 05/13/11. I explained that often, pain medications can cause constipation to get worse but that I would check with Dr Juanda Chance for recommendations. Dr Juanda Chance, please advise.

## 2011-05-01 NOTE — Telephone Encounter (Signed)
Spoke with patient and answered questions.

## 2011-05-01 NOTE — Telephone Encounter (Signed)
.   Please obtain KUB to assess for possible SBO . If OK, then she can takr a bottle of Mag citrate as a laxative

## 2011-05-02 ENCOUNTER — Encounter: Payer: Medicare Other | Admitting: Internal Medicine

## 2011-05-02 ENCOUNTER — Telehealth: Payer: Self-pay | Admitting: *Deleted

## 2011-05-02 ENCOUNTER — Ambulatory Visit (INDEPENDENT_AMBULATORY_CARE_PROVIDER_SITE_OTHER)
Admission: RE | Admit: 2011-05-02 | Discharge: 2011-05-02 | Disposition: A | Discharge status: Home / Self Care | Payer: Medicare Other | Source: Ambulatory Visit | Attending: Internal Medicine | Admitting: Internal Medicine

## 2011-05-02 DIAGNOSIS — K5900 Constipation, unspecified: Secondary | ICD-10-CM

## 2011-05-02 DIAGNOSIS — R109 Unspecified abdominal pain: Secondary | ICD-10-CM

## 2011-05-02 NOTE — Telephone Encounter (Signed)
Patient states that she is unsure that what Dr Juanda Chance has suggested will be helpful for her since she has taken Mag Citrate several times previously without relief. I have advised that Dr Juanda Chance would like a KUB to be completed first anyways and we will go from there once we get results. Patient also states that she needs her colonoscopy procedure much sooner than 05/13/11 because of the pain. I have advised that the first date available for propofol sedation is 05/13/11 but I will continue to look at 05/07/11 for openings. Patient verbalizes understanding and states that she will come today for KUB.

## 2011-05-02 NOTE — Telephone Encounter (Signed)
Patient given results and recommendations per Dr. Juanda Chance

## 2011-05-02 NOTE — Telephone Encounter (Signed)
Message copied by Daphine Deutscher on Fri May 02, 2011  1:17 PM ------      Message from: [place], [place] M      Created: Fri May 02, 2011 12:27 PM       Please call pt with KUB results, no obstruction, moderate stool burden. Please go ahead with Mad citrate, may repeat this weekend.

## 2011-05-09 ENCOUNTER — Telehealth: Payer: Self-pay | Admitting: Internal Medicine

## 2011-05-09 ENCOUNTER — Other Ambulatory Visit: Payer: Self-pay | Admitting: Internal Medicine

## 2011-05-09 NOTE — Telephone Encounter (Signed)
I agree with the management 

## 2011-05-09 NOTE — Telephone Encounter (Signed)
Patient states she was devastated when she had to reschedule her colonoscopy from 05/02/11 to 05/13/11. States she has had pain for 3 weeks and she was excited when she thought she would have the colonoscopy earlier. She is also upset that she had mixed up her Moviprep and is going to have to get another one. When she called the pharmacy they told her it had not been called in. Discussed with patient the reason for moving her colonoscopy for the special anesthesia she needs due to a torturous colon. Also, explained that we have  Moviprep for her here at our office for pick up. Patient understood and felt better. She wants Dr. Juanda Chance to know she is still having pain even after Magnesium Citrate and hopes the colonoscopy will tell what is wrong.

## 2011-05-13 ENCOUNTER — Encounter: Payer: Self-pay | Admitting: Internal Medicine

## 2011-05-13 ENCOUNTER — Telehealth: Payer: Self-pay | Admitting: *Deleted

## 2011-05-13 ENCOUNTER — Ambulatory Visit (AMBULATORY_SURGERY_CENTER): Payer: Medicare Other | Admitting: Internal Medicine

## 2011-05-13 VITALS — BP 146/85 | HR 86 | Temp 98.7°F | Resp 26 | Ht 64.0 in | Wt 188.0 lb

## 2011-05-13 DIAGNOSIS — D126 Benign neoplasm of colon, unspecified: Secondary | ICD-10-CM

## 2011-05-13 DIAGNOSIS — Q438 Other specified congenital malformations of intestine: Secondary | ICD-10-CM

## 2011-05-13 DIAGNOSIS — R1032 Left lower quadrant pain: Secondary | ICD-10-CM

## 2011-05-13 MED ORDER — SODIUM CHLORIDE 0.9 % IV SOLN: 500.0000 mL | INTRAVENOUS | Status: DC

## 2011-05-13 NOTE — Progress Notes (Signed)
Patient did not experience any of the following events: a burn prior to discharge; a fall within the facility; wrong site/side/patient/procedure/implant event; or a hospital transfer or hospital admission upon discharge from the facility. (G8907) Patient did not have preoperative order for IV antibiotic SSI prophylaxis. (G8918)  

## 2011-05-13 NOTE — Patient Instructions (Signed)
Please refer to your blue and neon green sheets for instructions regarding diet and activity for the rest of today.  You may resume your medications as you would normally take them.   Barium Exam scheduled for 05/27/2011 at 10:45am at [facility] Radiology Dept. Please pick up your prep for the exam a few days prior and follow the instructions given to you.  Colon Polyps A polyp is extra tissue that grows inside your body. Colon polyps grow in the large intestine. The large intestine, also called the colon, is part of your digestive system. It is a long, hollow tube at the end of your digestive tract where your body makes and stores stool. Most polyps are not dangerous. They are benign. This means they are not cancerous. But over time, some types of polyps can turn into cancer. Polyps that are smaller than a pea are usually not harmful. But larger polyps could someday become or may already be cancerous. To be safe, doctors remove all polyps and test them.  WHO GETS POLYPS? Anyone can get polyps, but certain people are more likely than others. You may have a greater chance of getting polyps if:  You are over 50.   You have had polyps before.   Someone in your family has had polyps.   Someone in your family has had cancer of the large intestine.   Find out if someone in your family has had polyps. You may also be more likely to get polyps if you:   Eat a lot of fatty foods.   Smoke.   Drink alcohol.   Do not exercise.   Eat too much.  SYMPTOMS  Most small polyps do not cause symptoms. People often do not know they have one until their caregiver finds it during a regular checkup or while testing them for something else. Some people do have symptoms like these:  Bleeding from the anus. You might notice blood on your underwear or on toilet paper after you have had a bowel movement.   Constipation or diarrhea that lasts more than a week.   Blood in the stool. Blood can make stool look  black or it can show up as red streaks in the stool.  If you have any of these symptoms, see your caregiver. HOW DOES THE DOCTOR TEST FOR POLYPS? The doctor can use four tests to check for polyps:  Digital rectal exam. The caregiver wears gloves and checks your rectum (the last part of the large intestine) to see if it feels normal. This test would find polyps only in the rectum. Your caregiver may need to do one of the other tests listed below to find polyps higher up in the intestine.   Barium enema. The caregiver puts a liquid called barium into your rectum before taking x-rays of your large intestine. Barium makes your intestine look white in the pictures. Polyps are dark, so they are easy to see.   Sigmoidoscopy. With this test, the caregiver can see inside your large intestine. A thin flexible tube is placed into your rectum. The device is called a sigmoidoscope, which has a light and a tiny video camera in it. The caregiver uses the sigmoidoscope to look at the last third of your large intestine.   Colonoscopy. This test is like sigmoidoscopy, but the caregiver looks at all of the large intestine. It usually requires sedation. This is the most common method for finding and removing polyps.  TREATMENT   The caregiver will remove  the polyp during sigmoidoscopy or colonoscopy. The polyp is then tested for cancer.   If you have had polyps, your caregiver may want you to get tested regularly in the future.  PREVENTION  There is not one sure way to prevent polyps. You might be able to lower your risk of getting them if you:  Eat more fruits and vegetables and less fatty food.   Do not smoke.   Avoid alcohol.   Exercise every day.   Lose weight if you are overweight.   Eating more calcium and folate can also lower your risk of getting polyps. Some foods that are rich in calcium are milk, cheese, and broccoli. Some foods that are rich in folate are chickpeas, kidney beans, and spinach.     Aspirin might help prevent polyps. Studies are under way.  Document Released: 12/19/2003 Document Revised: 12/04/2010 Document Reviewed: 05/26/2007 [facility] Patient Information 2012 [place], [place].

## 2011-05-13 NOTE — Telephone Encounter (Signed)
Per Dr. Juanda Chance, patient will need barium enema without air. Need to wait 2 weeks due to polyp removal. Incomplete colonoscopy due to redundant colon. Scheduled at [facility] radiology on 05/27/11 10:45/11:00 AM arrival. Will need to pick up prep kit couple days before procedure at [facility] radiology. Alinda Money) Whitney in recovery aware.

## 2011-05-13 NOTE — Op Note (Signed)
[facility] [address] [place], [place]  [address]  COLONOSCOPY PROCEDURE REPORT  PATIENT:  Laura Drake, Laura Drake  MR#:  [id number] BIRTHDATE:  09-07-1940, 70 yrs. old  GENDER:  female ENDOSCOPIST:  Hedwig Morton. Juanda Chance, MD REF. BY:  Willow Ora, M.D. PROCEDURE DATE:  05/13/2011 PROCEDURE:  Incomplete colonoscopy ASA CLASS:  Class II INDICATIONS:  Abdominal pain colon 2001 tortuous colon, now chronic LLQ abdominal pain MEDICATIONS:   MAC sedation, administered by CRNA, propofol (Diprivan) 500 mg  DESCRIPTION OF PROCEDURE:   After the risks and benefits and of the procedure were explained, informed consent was obtained. Digital rectal exam was performed and revealed no rectal masses. The LB [id number]-H180AL [id number] endoscope was introduced through the anus and advanced to the hepatic flexure.  The quality of the prep was good, using MoviPrep.  The instrument was then slowly withdrawn as the colon was fully examined. <<PROCEDUREIMAGES>>  FINDINGS:  incomplete exam. redundant transverse colon, sightly dilated, hypotonic, decreased abdominal wall muscle tone (see image1). Despite of extrinsic pressure and repositioning the scope never reached the cecum  A sessile polyp was found. at 20 cm 5 mm flat poly The polyp was removed using cold biopsy forceps (see image2).  This was otherwise a normal examination of the colon (see image4).   Retroflexed views in the rectum revealed no abnormalities.    The scope was then withdrawn from the patient and the procedure completed.  COMPLICATIONS:  None ENDOSCOPIC IMPRESSION: 1) Incomplete exam 2) Sessile polyp 3) Otherwise normal examination redundant hypotonis dilated colon, no stricture, no significant diverticulosis RECOMMENDATIONS: 1) Await pathology results Barium Enema  REPEAT EXAM:  In 0 year(s) for.  ______________________________ Hedwig Morton. Juanda Chance, MD  CC:  n. eSIGNED:   Hedwig Morton. Auda Finfrock at 05/13/2011 04:06 PM  Ardine Bjork,  [id number]

## 2011-05-14 ENCOUNTER — Telehealth: Payer: Self-pay | Admitting: Internal Medicine

## 2011-05-14 ENCOUNTER — Telehealth: Payer: Self-pay

## 2011-05-14 NOTE — Telephone Encounter (Signed)
Please take mild laxatives to have a BM every day. For instance MOM  3 tablespoons daily, or Mag Oxide pills 2 po qd. We have to wait for the BEnema.

## 2011-05-14 NOTE — Telephone Encounter (Signed)
Spoke with patient and she states she is concerned because she is still having the left sided abdominal pain that comes and goes. She understands that she cannot have the barium enema until 05/27/11 but feels she has been having this off and on pain for long enough. She wants to know if there is anything that can be done for her pain until the barium enema is done. Please, advise.

## 2011-05-14 NOTE — Telephone Encounter (Signed)
Left a message for the pt to call if any questions or concerns #[phone]. maw

## 2011-05-14 NOTE — Telephone Encounter (Signed)
Returned the phone call to the pt.  She told me that she was scheduled at the hospital for a barium enema d/t her incomplete colon yesterday.  Pt asked if she could schedule an appointment with Dr. Juanda Chance after the B.E. Is completed to discuss the finding and her treatment plan.  I called [facility] Cummins and pt's was transfered to make an appointment.  Maw

## 2011-05-15 MED ORDER — MAGNESIUM OXIDE 400 MG PO TABS: ORAL_TABLET | ORAL | Status: DC

## 2011-05-15 NOTE — Telephone Encounter (Signed)
Spoke with patient and gave her recommendations by Dr. Juanda Chance. Rx sent for Magnesium Oxide pills.

## 2011-05-19 ENCOUNTER — Encounter: Payer: Self-pay | Admitting: Internal Medicine

## 2011-05-20 ENCOUNTER — Encounter: Payer: Self-pay | Admitting: *Deleted

## 2011-05-20 ENCOUNTER — Telehealth: Payer: Self-pay | Admitting: Internal Medicine

## 2011-05-20 NOTE — Telephone Encounter (Signed)
Patient calling to report that she started taking the Magnesium tablets 2 po nightly on 05/15/11 alonf with Miralax. She had not had a bowel movement on 05/17/11 so she drank a bottle of Magnesium citrate with no results. On 05/18/11, she had a headache and felt bad so she did an enema. About 1:00 PM, she had small amount of diarrhea. Today, she is having cramping, abdominal pain and had one episode of large amount of diarrhea. She has postponed the barium enema. States she is bipolar and is is a down period since Christmas. States all of the bowel issues are adding to this. She has cancelled her barium enema. States she needs to be in a better physical and mental state to do this. Spoke with Dr. Juanda Chance. Patient to hold Magnesium pills and Miralax today. OV on 05/21/11 3:30/3:45 PM with Dr. Juanda Chance. Patient aware.

## 2011-05-21 ENCOUNTER — Ambulatory Visit (INDEPENDENT_AMBULATORY_CARE_PROVIDER_SITE_OTHER): Payer: Medicare Other | Admitting: Internal Medicine

## 2011-05-21 ENCOUNTER — Encounter: Payer: Self-pay | Admitting: Internal Medicine

## 2011-05-21 DIAGNOSIS — K5989 Other specified functional intestinal disorders: Secondary | ICD-10-CM

## 2011-05-21 DIAGNOSIS — K5900 Constipation, unspecified: Secondary | ICD-10-CM

## 2011-05-21 MED ORDER — TRAMADOL HCL 50 MG PO TABS: ORAL_TABLET | ORAL | Status: DC

## 2011-05-21 NOTE — Patient Instructions (Addendum)
We have sent the following medications to your pharmacy for you to pick up at your convenience: Tramadol CC: Dr Drue Novel, Dr Phillip Heal

## 2011-05-21 NOTE — Progress Notes (Signed)
Laura Drake 24-Nov-1940 MRN [id number]   History of Present Illness:  This is a 71 year old white female with  irregular bowel habits, predominant constipation and redundant tortuous colon which was confirmed  2 weeks ago and on a colonoscopy. The exam was incomplete due to redundancy of her colon. She was scheduled for a barium enema but canceled the procedure because she is in the depression stage of her bipolar disease followed by Laura Drake. She would like to postpone her x-ray until she is feeling better. She has been on recommended magnesium oxide 2 tablets daily, MiraLax 17 g daily. She uses enemas when necessary and magnesium citrate when necessary. She had 2 bowel movements yesterday as well as today. She is feeling fine today without having any distention or abdominal pain.    Past Medical History  Diagnosis Date  . DJD (degenerative joint disease)   . Depression     h/o suicidal attempt 51  . Hyperlipidemia   . Bipolar affective disorder     dx at age 57   . Glaucoma   . Hypothyroidism   . Hernia, inguinal     per CT 3-12  . S/P colonoscopy 03-13-2000    hemorrhoids, tortous sigmoid, Laura Drake  . OSA (obstructive sleep apnea) 09-24-09    + sleep study, "moderate"  . Fracture of leg 1991    L leg, cast x 6 months   . Hemorrhoids   . Adenomatous polyp of colon 2013   Past Surgical History  Procedure Date  . Radical hysterectomy 1979    reports that she has never smoked. She has never used smokeless tobacco. She reports that she drinks alcohol. She reports that she does not use illicit drugs. family history includes Bipolar disorder in her father; Colon cancer in her paternal grandmother; Diabetes in her sister; Other in her sister; and Parkinsonism in her mother. Allergies  Allergen Reactions  . Other Swelling    Shellfish  . Sulfa Antibiotics         Review of Systems:Denies dysphagia, odynophagia or shortness of breath or chest pain  The remainder of the 10  point ROS is negative except as outlined in H&P   Physical Exam: General appearance  Well developed, in no distress. Eyes- non icteric. HEENT nontraumatic, normocephalic. Mouth no lesions, tongue papillated, no cheilosis. Neck supple without adenopathy, thyroid not enlarged, no carotid bruits, no JVD. Lungs Clear to auscultation bilaterally. Cor normal S1, normal S2, regular rhythm, no murmur,  quiet precordium. Abdomen: Soft abdomen tender left lower quadrant. Normoactive bowel sounds. No distention.  Rectal:Not repeated  Extremities no pedal edema. Skin no lesions. Neurological alert and oriented x 3. Psychological normal mood and affect.  Assessment and Plan:  Problem #1 We have discussed extensively her redundant colon and her laxative regimen. It may be necessary for her to continue to take laxatives due to abnormal bowel function. She will alternate MiraLax 17 g every other day and take magnesium oxide daily. She will try to be more physically active and stay on a high fiber diet. She will need a barium enema in the near future when her depression cycle subsides. Her medications for bipolar disorder may have to be adjusted as per Laura Drake. She asked for something for pain so we will send tramadol 50 mg to take 1-2 every 6 hours when necessary.  05/21/2011 Laura Drake

## 2011-05-22 ENCOUNTER — Telehealth: Payer: Self-pay | Admitting: Internal Medicine

## 2011-05-22 NOTE — Telephone Encounter (Signed)
Patient states she took Tramadol 2 tabs last night at 6:30 PM. A little later, she was dizzy and then when she went to bed she started itching "out of my skin." States she itched off and on all night. Instructed patient to not take any more Tramadol. She is asking if Dr. Juanda Chance has any other suggestions for her pain medication. Please, advise.

## 2011-05-22 NOTE — Telephone Encounter (Signed)
Unfortunately, there are no other mild pain medications.Tylenol is all can offer.

## 2011-05-23 NOTE — Telephone Encounter (Signed)
Spoke with patient and gave her Dr. Regino Schultze recommendation. Also, cancelled appointment on 06/03/11 since patient was seen this week.

## 2011-05-27 ENCOUNTER — Other Ambulatory Visit (HOSPITAL_COMMUNITY): Payer: Medicare Other

## 2011-05-28 ENCOUNTER — Ambulatory Visit (INDEPENDENT_AMBULATORY_CARE_PROVIDER_SITE_OTHER): Payer: Medicare Other | Admitting: Internal Medicine

## 2011-05-28 ENCOUNTER — Telehealth: Payer: Self-pay | Admitting: Internal Medicine

## 2011-05-28 ENCOUNTER — Encounter: Payer: Self-pay | Admitting: Internal Medicine

## 2011-05-28 VITALS — BP 138/86 | HR 86 | Temp 98.3°F | Wt 181.0 lb

## 2011-05-28 DIAGNOSIS — R109 Unspecified abdominal pain: Secondary | ICD-10-CM

## 2011-05-28 DIAGNOSIS — N390 Urinary tract infection, site not specified: Secondary | ICD-10-CM

## 2011-05-28 LAB — POCT URINALYSIS DIPSTICK
Blood, UA: NEGATIVE
Glucose, UA: NEGATIVE
Ketones, UA: NEGATIVE
Nitrite, UA: NEGATIVE
Protein, UA: NEGATIVE
Spec Grav, UA: 1.01
Urobilinogen, UA: 0.2
pH, UA: 6.5

## 2011-05-28 MED ORDER — HYOSCYAMINE SULFATE 0.125 MG PO TABS: 0.1250 mg | ORAL_TABLET | ORAL | Status: AC | PRN

## 2011-05-28 NOTE — Telephone Encounter (Signed)
Thanks

## 2011-05-28 NOTE — Patient Instructions (Signed)
Try hyoscyamine for that pain in the abdomen as needed We are sending the urine for culture, in the meantime drink plenty of fluids and cranberry juice. If you get worse let us know.

## 2011-05-28 NOTE — Telephone Encounter (Signed)
Pt schedule for OV today.

## 2011-05-28 NOTE — Progress Notes (Signed)
  Subjective:    Patient ID: Laura Drake, female    DOB: May 25, 1940, 71 y.o.   MRN: [id number]  HPI Acute visit, UTI? Having on-off symptoms for last 5 days: Nocturia, some dysuria for the last 2 days, strong odor in the  urine. She also has chronic lower and left lower quadrant abdominal pain, was recently seen by GI, colonoscopy was okay. Was prescribed Ultram but had a rash w/ it   Past Medical History  Diagnosis Date  . DJD (degenerative joint disease)   . Depression     h/o suicidal attempt 13  . Hyperlipidemia   . Bipolar affective disorder     dx at age 62   . Glaucoma   . Hypothyroidism   . Hernia, inguinal     per CT 3-12  . S/P colonoscopy 03-13-2000    hemorrhoids, tortous sigmoid, Dr Ewing Schlein  . OSA (obstructive sleep apnea) 09-24-09    + sleep study, "moderate"  . Fracture of leg 1991    L leg, cast x 6 months   . Hemorrhoids   . Adenomatous polyp of colon 2013     Review of Systems No fever or chills No nausea, vomiting, diarrhea. No vaginal discharge or vaginal rash. As far as her depression, she is going through a depressive episode related to bipolar, denies any suicidal ideas.     Objective:   Physical Exam Alert, oriented x3, slightly emotional but no distress. Abdomen not distended, soft, slightly tender in the suprapubic area which is something new. Slightly tender in the left lower quadrant which is not new. No mass or rebound. No CVA tenderness Extremities no edema.        Assessment & Plan:  UTI? Will treat base on ucx, see instructions

## 2011-05-28 NOTE — Telephone Encounter (Signed)
Patient called stating she has a UTI and would like Dr. Drue Novel to call her in Amoxycillin. Patient states she has seen Dr. Drue Novel for this before and she believes it is the result of colonoscopy complications and bipolar disorder. Patient states she will come in to be seen if necessary.

## 2011-05-28 NOTE — Assessment & Plan Note (Addendum)
Chronic abdominal pain, lower abdomen and left lower quadrant.  Recent Cscope  Negative.Reports previous Urology evaluations as well  Tried  Ultram but developed a rash. Plan -- trial w/ levsin, no interaction w/ psych meds per UTD

## 2011-05-30 MED ORDER — AMOXICILLIN-POT CLAVULANATE 500-125 MG PO TABS: 1.0000 | ORAL_TABLET | Freq: Three times a day (TID) | ORAL | Status: AC

## 2011-05-30 NOTE — Progress Notes (Signed)
Addended by: Edwena Felty T on: 05/30/2011 05:34 PM   Modules accepted: Orders

## 2011-05-31 LAB — URINE CULTURE: Colony Count: >=100000

## 2011-06-03 ENCOUNTER — Ambulatory Visit: Payer: Medicare Other | Admitting: Internal Medicine

## 2011-06-04 ENCOUNTER — Telehealth: Payer: Self-pay | Admitting: Internal Medicine

## 2011-06-04 NOTE — Telephone Encounter (Signed)
Discontinue levsin, this is a chronic abdominal pain, needs to discuss with GI further treatment. It is not appropriate to prescribe chronic narcotics in this situation. No Percocet.

## 2011-06-04 NOTE — Telephone Encounter (Signed)
Patient states she was given levsin for cramping last week. Patient explains she has a bad reaction to it and would like [place] to prescribe her percocet instead. She states she is not cramping, but in pain. She also states that levsin is a sulfa drug and she is allergic to it.

## 2011-06-04 NOTE — Telephone Encounter (Signed)
Pt states that med cause her to shake, and just feel bad. Pt could not give any other specific on med reaction beside it was a sulfa drug and she is allergic to them.Please advise

## 2011-06-04 NOTE — Telephone Encounter (Signed)
LMOVM for pt to return call 

## 2011-06-05 ENCOUNTER — Telehealth: Payer: Self-pay | Admitting: Internal Medicine

## 2011-06-05 NOTE — Telephone Encounter (Signed)
Patient called the office asking to speak with Lillia Abed. She stated that she was returning her call. Best number to reach Mrs. Barbone is [phone].

## 2011-06-05 NOTE — Telephone Encounter (Signed)
Spoke with pt & told her we were unable to prescribe her anything & I offered to refer her to another GI & she hung-up.

## 2011-06-05 NOTE — Telephone Encounter (Signed)
Pt states she is no longer seeing her GI for personal reasons. & would like to know if you will prescribe her something for pain that is a non-narcotic. Please advise.

## 2011-06-05 NOTE — Telephone Encounter (Signed)
noted 

## 2011-06-05 NOTE — Telephone Encounter (Signed)
Needs to see GI, if needs a new GI, arrange a referral

## 2011-06-13 ENCOUNTER — Telehealth: Payer: Self-pay | Admitting: *Deleted

## 2011-06-13 MED ORDER — NITROFURANTOIN MONOHYD MACRO 100 MG PO CAPS: 100.0000 mg | ORAL_CAPSULE | Freq: Two times a day (BID) | ORAL | Status: AC

## 2011-06-13 NOTE — Telephone Encounter (Signed)
Can have Macrobid 100mg  bid x7 days for UTI- if sxs don't improve will need appt

## 2011-06-13 NOTE — Telephone Encounter (Signed)
Discuss with patient, Rx sent. 

## 2011-06-13 NOTE — Telephone Encounter (Signed)
[facility] Triage Call Report Triage Record Num: [id number] Operator: Baldomero Lamy Patient Name: Laura Drake Call Date & Time: 06/13/2011 2:57:47PM Patient Phone: [phone] PCP: Nolon Rod. Paz Patient Gender: Female PCP Fax : Patient DOB: November 27, 1940 Practice Name: [facility] Reason for Call: Caller: [name]/Patient; PCP: Willow Ora; CB#: [phone]; ; ; Call regarding Recurrance of UTI Sxs; PT CALLING REQUESTING NEW ANTIBIOTIC AND CALL BACK TODAY, 3/8. States she was seen for UTI sxs on 2/22 and tx with Augmentin but was unable to finish because it upset her stomach. Pt having same sxs no better no worse. Emergent sxs r/o. Disp: see in 72 hrs. Sent note for request for new antibiotic. Pharm on file. Protocol(s) Used: Urinary Symptoms - Female Recommended Outcome per Protocol: See Provider within 72 Hours Reason for Outcome: Evaluated by provider AND no improvement in symptoms after following treatment plan for the time specified by provider Care Advice: Call provider if you develop vomiting, abdominal pain, or symptoms of dehydration (such as extreme thirst, dry mouth and tongue, dry skin, or restlessness/irritability). ~ ~ Do not change prescribed medications, dosing regimen, or other treatments until consulting with your provider. Continue to follow your treatment plan. Take all medications as prescribed and keep all appointments with your provider. ~ ~ SYMPTOM / CONDITION MANAGEMENT ~ Drink liquids throughout waking hours; limit fluids 2 to 3 hours before your usual bedtime. Most adults need to drink 6-10 eight-ounce glasses (1.2-2.0 liters) of fluids per day unless previously told to limit fluid intake for other medical reasons. Limit fluids that contain caffeine, sugar or alcohol. Urine will be a very light yellow color when you drink enough fluids. ~ ~ Call provider if urine is pink, red, smoky or cola colored. 06/13/2011 3:16:59PM

## 2011-07-03 ENCOUNTER — Ambulatory Visit (INDEPENDENT_AMBULATORY_CARE_PROVIDER_SITE_OTHER): Payer: Medicare Other | Admitting: Family Medicine

## 2011-07-03 VITALS — BP 125/70 | HR 78 | Temp 98.0°F | Resp 16 | Ht 63.5 in | Wt 177.4 lb

## 2011-07-03 DIAGNOSIS — H6120 Impacted cerumen, unspecified ear: Secondary | ICD-10-CM

## 2011-07-03 DIAGNOSIS — H9209 Otalgia, unspecified ear: Secondary | ICD-10-CM

## 2011-07-03 NOTE — Progress Notes (Signed)
  Subjective:    Patient ID: Laura Drake, female    DOB: Mar 22, 1941, 71 y.o.   MRN: [id number]  HPI Left ear is popping.  Some muffled hearing.  History of recurrent impacted ears.  No ear pain. No drainage.  Uses Q-tip to get wax out.  No tinnitus.  No jaw pain.  PCP: Willow Ora  YNW:GNFAOZ of Systems  Constitutional: Negative for fever.  HENT: Negative for facial swelling.   Neurological: Negative for dizziness and light-headedness.  Psychiatric/Behavioral: Positive for behavioral problems.  Bipolar     Objective:   Physical Exam  Constitutional: She is oriented to person, place, and time. She appears well-developed and well-nourished.  HENT:  Head: Normocephalic and atraumatic.  Right Ear: External ear normal.  Left Ear: External ear normal.  Eyes: Pupils are equal, round, and reactive to light.  Neck: Normal range of motion. Neck supple.  Cardiovascular: Normal rate and regular rhythm.   No murmur heard. Pulmonary/Chest: Effort normal and breath sounds normal. No respiratory distress.  Neurological: She is alert and oriented to person, place, and time.  Skin: Skin is warm and dry.  Psychiatric: She has a normal mood and affect. Her behavior is normal. Judgment and thought content normal.  Partially impacted left canal        Assessment & Plan:  Impacted left ear canal   Manual disimpaction   Follow up as needed    Patient info given   Recommended to avoid using Q-tips to clean out ears   Use over the counter ear wax softener

## 2011-07-03 NOTE — Patient Instructions (Signed)
Discharge Instructions Browse by Alphabet A B C D E F G H I J K L M N O P Q R S T U V W X Y Z  Browse by Category All Documents Allergy and Immunology Anesthesiology Bariatrics Bioterrorism Cardiology Critical Care Dentistry Dermatology Diabetes Dietary Easy-to-Read Emergency Medicine Endocrinology ENT Family Medicine Forms Gastroenterology Geriatrics Infectious Disease Internal Medicine Labs and Tests Neonatology Nephrology Neurology Obstetrics and Gynecology Oncology Ophthalmology Orthopedics Pediatrics Pharmacology Physical Medicine and Rehabilitation Podiatry Preventative Medicine Procedures Psychiatry Pulmonary Medicine Radiology Rheumatology Surgery Urology Drug Information Sheets All Drug Information Sheets  Browse by Alphabet A B C D E F G H I J K L M N O P Q R S T U V W X Y Z  

## 2011-09-02 ENCOUNTER — Ambulatory Visit (INDEPENDENT_AMBULATORY_CARE_PROVIDER_SITE_OTHER): Payer: Medicare Other | Admitting: Internal Medicine

## 2011-09-02 ENCOUNTER — Encounter: Payer: Self-pay | Admitting: Internal Medicine

## 2011-09-02 VITALS — BP 112/82 | HR 81 | Temp 98.1°F | Wt 176.0 lb

## 2011-09-02 DIAGNOSIS — R300 Dysuria: Secondary | ICD-10-CM

## 2011-09-02 DIAGNOSIS — F319 Bipolar disorder, unspecified: Secondary | ICD-10-CM

## 2011-09-02 DIAGNOSIS — N390 Urinary tract infection, site not specified: Secondary | ICD-10-CM

## 2011-09-02 DIAGNOSIS — E039 Hypothyroidism, unspecified: Secondary | ICD-10-CM

## 2011-09-02 LAB — POCT URINALYSIS DIPSTICK
Bilirubin, UA: NEGATIVE
Blood, UA: NEGATIVE
Glucose, UA: NEGATIVE
Ketones, UA: NEGATIVE
Nitrite, UA: NEGATIVE
Protein, UA: NEGATIVE
Spec Grav, UA: 1.015
Urobilinogen, UA: 0.2
pH, UA: 6

## 2011-09-02 LAB — TSH: TSH: 2.6 u[IU]/mL (ref 0.35–5.50)

## 2011-09-02 NOTE — Patient Instructions (Addendum)
Drink plenty of fluids, call if they UTI symptoms get worse. Please come back next month for your routine checkup.

## 2011-09-02 NOTE — Progress Notes (Signed)
  Subjective:    Patient ID: Laura Drake, female    DOB: 04-12-1940, 71 y.o.   MRN: [id number]  HPI Acute visit Recently he has seen psychiatry with severe depression, was not taking Prozac, she is back on it now. Reports that she was taking Synthroid regularly up until 2 months ago when she started to take it on and off, her psychiatrist is worried about  that , pt  likes a TSH check.  Also reports mild dysuria for few days  Past Medical History  Diagnosis Date  . DJD (degenerative joint disease)   . Depression     h/o suicidal attempt 36  . Hyperlipidemia   . Bipolar affective disorder     dx at age 49   . Glaucoma   . Hypothyroidism   . Hernia, inguinal     per CT 3-12  . S/P colonoscopy 03-13-2000    hemorrhoids, tortous sigmoid, Dr Ewing Schlein  . OSA (obstructive sleep apnea) 09-24-09    + sleep study, "moderate"  . Fracture of leg 1991    L leg, cast x 6 months   . Hemorrhoids   . Adenomatous polyp of colon 2013     Review of Systems No fever, chills. No nausea or vomiting. Currently not suicidal. Complains of hair loss for the last few weeks    Objective:   Physical Exam  General -- alert, well-developed. no apparent distress.  Lungs -- normal respiratory effort, no intercostal retractions, no accessory muscle use, and normal breath sounds.   Heart-- normal rate, regular rhythm, no murmur, and no gallop.   Abdomen--soft, non-tender, no distention, no masses, no CVA tenderness  Extremities-- no pretibial edema bilaterally  Skin-- Scalp normal to inspection, hair  seems normal today.      Assessment & Plan:

## 2011-09-02 NOTE — Assessment & Plan Note (Signed)
Good compliance with Synthroid up to March 2013, since then is taking it on and off. Labs Recommend compliance.

## 2011-09-02 NOTE — Assessment & Plan Note (Signed)
Symptoms suggest a UTI. Will check a urine culture and not in that she is allergic to sulfa and Cipro interacts with lithium. Will prescribe antibiotics if appropriate based on the culture and sensitivity

## 2011-09-02 NOTE — Assessment & Plan Note (Signed)
Reports close followup by psychiatry. Currently not suicidal

## 2011-09-03 ENCOUNTER — Encounter: Payer: Self-pay | Admitting: *Deleted

## 2011-09-04 LAB — URINE CULTURE: Colony Count: >=100000

## 2011-09-04 MED ORDER — CEFPROZIL 500 MG PO TABS: 500.0000 mg | ORAL_TABLET | Freq: Two times a day (BID) | ORAL | Status: AC

## 2011-09-04 NOTE — Progress Notes (Signed)
Addended by: Edwena Felty T on: 09/04/2011 02:38 PM   Modules accepted: Orders

## 2011-09-08 ENCOUNTER — Telehealth: Payer: Self-pay | Admitting: *Deleted

## 2011-09-08 DIAGNOSIS — L659 Nonscarring hair loss, unspecified: Secondary | ICD-10-CM

## 2011-09-08 MED ORDER — LEVOTHYROXINE SODIUM 25 MCG PO TABS: ORAL_TABLET | ORAL | Status: DC

## 2011-09-08 NOTE — Telephone Encounter (Signed)
Discussed with pt & entered referral.

## 2011-09-08 NOTE — Telephone Encounter (Signed)
Dermatology referral if so desired

## 2011-09-08 NOTE — Progress Notes (Signed)
Addended by: Edwena Felty T on: 09/08/2011 03:23 PM   Modules accepted: Orders

## 2011-09-08 NOTE — Telephone Encounter (Signed)
Pt called stating that she is continuing to have hair loss. Pt would like to know if there is anything that she can do for this. Please advise.

## 2011-10-14 ENCOUNTER — Emergency Department (HOSPITAL_BASED_OUTPATIENT_CLINIC_OR_DEPARTMENT_OTHER): Payer: No Typology Code available for payment source

## 2011-10-14 ENCOUNTER — Encounter (HOSPITAL_BASED_OUTPATIENT_CLINIC_OR_DEPARTMENT_OTHER): Payer: Self-pay | Admitting: *Deleted

## 2011-10-14 ENCOUNTER — Emergency Department (HOSPITAL_BASED_OUTPATIENT_CLINIC_OR_DEPARTMENT_OTHER)
Admission: EM | Admit: 2011-10-14 | Discharge: 2011-10-14 | Disposition: A | Discharge status: Home / Self Care | Payer: No Typology Code available for payment source | Attending: Emergency Medicine | Admitting: Emergency Medicine

## 2011-10-14 DIAGNOSIS — K4090 Unilateral inguinal hernia, without obstruction or gangrene, not specified as recurrent: Secondary | ICD-10-CM | POA: Insufficient documentation

## 2011-10-14 DIAGNOSIS — Z90710 Acquired absence of both cervix and uterus: Secondary | ICD-10-CM | POA: Insufficient documentation

## 2011-10-14 DIAGNOSIS — R10812 Left upper quadrant abdominal tenderness: Secondary | ICD-10-CM | POA: Insufficient documentation

## 2011-10-14 DIAGNOSIS — T1490XA Injury, unspecified, initial encounter: Secondary | ICD-10-CM | POA: Insufficient documentation

## 2011-10-14 DIAGNOSIS — R51 Headache: Secondary | ICD-10-CM | POA: Insufficient documentation

## 2011-10-14 DIAGNOSIS — IMO0002 Reserved for concepts with insufficient information to code with codable children: Secondary | ICD-10-CM | POA: Insufficient documentation

## 2011-10-14 DIAGNOSIS — M542 Cervicalgia: Secondary | ICD-10-CM | POA: Insufficient documentation

## 2011-10-14 DIAGNOSIS — E039 Hypothyroidism, unspecified: Secondary | ICD-10-CM | POA: Insufficient documentation

## 2011-10-14 DIAGNOSIS — R079 Chest pain, unspecified: Secondary | ICD-10-CM | POA: Insufficient documentation

## 2011-10-14 LAB — CBC WITH DIFFERENTIAL/PLATELET
Basophils Absolute: 0.1 10*3/uL (ref 0.0–0.1)
Basophils Relative: 1 % (ref 0–1)
Eosinophils Absolute: 0.2 10*3/uL (ref 0.0–0.7)
Eosinophils Relative: 2 % (ref 0–5)
HCT: 38.9 % (ref 36.0–46.0)
Hemoglobin: 13.4 g/dL (ref 12.0–15.0)
Lymphocytes Relative: 21 % (ref 12–46)
Lymphs Abs: 2.3 10*3/uL (ref 0.7–4.0)
MCH: 30.3 pg (ref 26.0–34.0)
MCHC: 34.4 g/dL (ref 30.0–36.0)
MCV: 88 fL (ref 78.0–100.0)
Monocytes Absolute: 0.9 10*3/uL (ref 0.1–1.0)
Monocytes Relative: 8 % (ref 3–12)
Neutro Abs: 7.6 10*3/uL (ref 1.7–7.7)
Neutrophils Relative %: 69 % (ref 43–77)
Platelets: 387 10*3/uL (ref 150–400)
RBC: 4.42 MIL/uL (ref 3.87–5.11)
RDW: 13.7 % (ref 11.5–15.5)
WBC: 11 10*3/uL — ABNORMAL HIGH (ref 4.0–10.5)

## 2011-10-14 LAB — COMPREHENSIVE METABOLIC PANEL
ALT: 7 U/L (ref 0–35)
AST: 12 U/L (ref 0–37)
Albumin: 3.8 g/dL (ref 3.5–5.2)
Alkaline Phosphatase: 82 U/L (ref 39–117)
BUN: 13 mg/dL (ref 6–23)
CO2: 26 mEq/L (ref 19–32)
Calcium: 9.4 mg/dL (ref 8.4–10.5)
Chloride: 103 mEq/L (ref 96–112)
Creatinine, Ser: 0.9 mg/dL (ref 0.50–1.10)
GFR calc Af Amer: 72 mL/min — ABNORMAL LOW (ref >90)
GFR calc non Af Amer: 62 mL/min — ABNORMAL LOW (ref >90)
Glucose, Bld: 105 mg/dL — ABNORMAL HIGH (ref 70–99)
Potassium: 3.9 mEq/L (ref 3.5–5.1)
Sodium: 139 mEq/L (ref 135–145)
Total Bilirubin: 0.3 mg/dL (ref 0.3–1.2)
Total Protein: 7 g/dL (ref 6.0–8.3)

## 2011-10-14 LAB — URINALYSIS, ROUTINE W REFLEX MICROSCOPIC
Bilirubin Urine: NEGATIVE
Glucose, UA: NEGATIVE mg/dL
Hgb urine dipstick: NEGATIVE
Ketones, ur: NEGATIVE mg/dL
Nitrite: NEGATIVE
Protein, ur: NEGATIVE mg/dL
Specific Gravity, Urine: >1.046 — ABNORMAL HIGH (ref 1.005–1.030)
Urobilinogen, UA: 1 mg/dL (ref 0.0–1.0)
pH: 6 (ref 5.0–8.0)

## 2011-10-14 LAB — APTT: aPTT: 38 seconds — ABNORMAL HIGH (ref 24–37)

## 2011-10-14 LAB — URINE MICROSCOPIC-ADD ON

## 2011-10-14 LAB — PROTIME-INR
INR: 1.04 (ref 0.00–1.49)
Prothrombin Time: 13.8 seconds (ref 11.6–15.2)

## 2011-10-14 MED ORDER — FENTANYL CITRATE 0.05 MG/ML IJ SOLN
50.0000 ug | Freq: Once | INTRAMUSCULAR | Status: AC
  Administered 2011-10-14: 50 ug via INTRAVENOUS
  Filled 2011-10-14: qty 2

## 2011-10-14 MED ORDER — ONDANSETRON HCL 4 MG/2ML IJ SOLN
4.0000 mg | Freq: Once | INTRAMUSCULAR | Status: AC
  Administered 2011-10-14: 4 mg via INTRAVENOUS
  Filled 2011-10-14: qty 2

## 2011-10-14 MED ORDER — CYCLOBENZAPRINE HCL 10 MG PO TABS: 10.0000 mg | ORAL_TABLET | Freq: Three times a day (TID) | ORAL | Status: AC | PRN

## 2011-10-14 MED ORDER — IOHEXOL 300 MG/ML  SOLN
100.0000 mL | Freq: Once | INTRAMUSCULAR | Status: AC | PRN
  Administered 2011-10-14: 100 mL via INTRAVENOUS
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
## 2011-10-14 NOTE — ED Notes (Signed)
MD aware of urine. No orders received.

## 2011-10-14 NOTE — ED Notes (Signed)
C/o posterior neck pain. Will place collar as ordered

## 2011-10-14 NOTE — ED Notes (Signed)
Pt. C/o nausea after fentanyl. Will update MD.

## 2011-10-14 NOTE — ED Notes (Signed)
MD at bedside. 

## 2011-10-14 NOTE — ED Notes (Signed)
Encouraged urine from pt. With no success.

## 2011-12-11 ENCOUNTER — Other Ambulatory Visit (INDEPENDENT_AMBULATORY_CARE_PROVIDER_SITE_OTHER): Payer: Medicare Other

## 2011-12-11 DIAGNOSIS — N390 Urinary tract infection, site not specified: Secondary | ICD-10-CM

## 2011-12-11 LAB — POCT URINALYSIS DIPSTICK
Bilirubin, UA: NEGATIVE
Blood, UA: NEGATIVE
Glucose, UA: NEGATIVE
Ketones, UA: NEGATIVE
Nitrite, UA: NEGATIVE
Protein, UA: NEGATIVE
Spec Grav, UA: 1.01
Urobilinogen, UA: 0.2
pH, UA: 6

## 2011-12-12 ENCOUNTER — Telehealth: Payer: Self-pay | Admitting: Internal Medicine

## 2011-12-12 NOTE — Telephone Encounter (Signed)
please call cell when results are back from UA from yesterday per pt CB [phone]

## 2011-12-13 LAB — URINE CULTURE: Colony Count: >=100000

## 2011-12-15 ENCOUNTER — Telehealth: Payer: Self-pay | Admitting: Internal Medicine

## 2011-12-15 NOTE — Telephone Encounter (Signed)
Patient Laura Drake is returning call from [place] regarding lab results. Please call back asap.

## 2011-12-15 NOTE — Telephone Encounter (Signed)
LMOVM for pt to return call. Pt also needs to schedule an OV.

## 2011-12-16 NOTE — Telephone Encounter (Signed)
LMOVM for pt to return call & schedule OV.  

## 2011-12-17 ENCOUNTER — Encounter: Payer: Self-pay | Admitting: Internal Medicine

## 2011-12-17 ENCOUNTER — Ambulatory Visit (INDEPENDENT_AMBULATORY_CARE_PROVIDER_SITE_OTHER): Payer: Medicare Other | Admitting: Internal Medicine

## 2011-12-17 VITALS — BP 120/74 | HR 88 | Temp 98.2°F | Wt 179.0 lb

## 2011-12-17 DIAGNOSIS — N390 Urinary tract infection, site not specified: Secondary | ICD-10-CM

## 2011-12-17 DIAGNOSIS — E039 Hypothyroidism, unspecified: Secondary | ICD-10-CM

## 2011-12-17 DIAGNOSIS — F319 Bipolar disorder, unspecified: Secondary | ICD-10-CM

## 2011-12-17 NOTE — Assessment & Plan Note (Signed)
Reports good compliance with Synthroid, last TSH normal. No change.

## 2011-12-17 NOTE — Progress Notes (Signed)
  Subjective:    Patient ID: Laura Drake, female    DOB: 09-Oct-1940, 71 y.o.   MRN: [id number]  HPI Routine office visit Hypothyroidism good medication compliance History of a UTI, patient thinks she still has a UTI. See assessment and plan. Recently, has seen her gynecologist several times, eventually diagnosed with chronic bacterial vaginitis, on Cleocin daily. She does have a mild clear discharge most days.  Past Medical History  Diagnosis Date  . DJD (degenerative joint disease)   . Depression     h/o suicidal attempt 76  . Hyperlipidemia   . Bipolar affective disorder     dx at age 61   . Glaucoma   . Hypothyroidism   . Hernia, inguinal     per CT 3-12  . S/P colonoscopy 03-13-2000    hemorrhoids, tortous sigmoid, Dr Ewing Schlein  . OSA (obstructive sleep apnea) 09-24-09    + sleep study, "moderate"  . Fracture of leg 1991    L leg, cast x 6 months   . Hemorrhoids   . Adenomatous polyp of colon 2013   Past Surgical History  Procedure Date  . Radical hysterectomy 1979     Review of Systems Denies actual dysuria, frequency or gross hematuria. Occasionally has lower abdominal discomfort but no nausea vomiting or blood in the stools. Very rarely has diarrhea. Emotionally doing well, sees psychiatry every 6 weeks  Current Outpatient Rx  Name Route Sig Dispense Refill  . CLINDAMYCIN PHOSPHATE 2 % VA CREA Vaginal Place 1 applicator vaginally at bedtime.    . FLUOXETINE HCL 10 MG PO CAPS Oral Take 10 mg by mouth daily.    Marland Kitchen LAMOTRIGINE 25 MG PO TABS Oral Take 25 mg by mouth daily. Take 2-3 tabs daily.  Anti-seizure medication.  Consult needed.    Marland Kitchen LATANOPROST 0.005 % OP SOLN  2 drops at bedtime.    Marland Kitchen LEVOTHYROXINE SODIUM 25 MCG PO TABS  1 1/2 by mouth daily 45 tablet 5  . LITHIUM CARBONATE ER 300 MG PO TBCR Oral Take 300 mg by mouth daily.      Marland Kitchen LORAZEPAM 1 MG PO TABS Oral Take 0.5 mg by mouth 2 (two) times daily as needed. Patient uses 0.5 milligrams of this in the morning  and 1 milligram in the evening.    Marland Kitchen POLYETHYLENE GLYCOL 3350 PO POWD Oral Take 17 g by mouth as needed. Patient uses this medication for constipation.    . RED YEAST RICE PO Oral Take 1 capsule by mouth daily.          Objective:   Physical Exam General -- alert, well-developed, and overweight appearing. No apparent distress.  Lungs -- normal respiratory effort, no intercostal retractions, no accessory muscle use, and normal breath sounds.   Heart-- normal rate, regular rhythm, no murmur, and no gallop.   Abdomen--soft, non-tender, no distention, no masses, no HSM, no guarding, and no rigidity.  No CVA tenderness  Extremities-- no pretibial edema bilaterally       Assessment & Plan:

## 2011-12-17 NOTE — Assessment & Plan Note (Addendum)
Documented UTI 08/2011, recent UA 12/11/2011 show some leukocytes but urine culture was contaminated. Patient is asymptomatic. She does have chronic bacterial vaginitis followup by  gyn. Patient likes another ucx done, she still feels like she has a UTI. If urine culture negative no further eval will be needed. If urine culture is contaminated, may be secondary to bacterial vaginitis

## 2011-12-17 NOTE — Telephone Encounter (Signed)
Pt is coming in today for an appt

## 2011-12-17 NOTE — Assessment & Plan Note (Signed)
Reports she sees her psychiatrist every 6 weeks, she seems to be doing very well today.

## 2011-12-17 NOTE — Telephone Encounter (Signed)
Pt coming in for f/u 145pm today

## 2011-12-17 NOTE — Patient Instructions (Addendum)
Schedule a visit by December for a complete physical exam, fasting

## 2012-01-13 ENCOUNTER — Ambulatory Visit (INDEPENDENT_AMBULATORY_CARE_PROVIDER_SITE_OTHER): Payer: Medicare Other

## 2012-01-13 DIAGNOSIS — Z23 Encounter for immunization: Secondary | ICD-10-CM

## 2012-01-13 DIAGNOSIS — Z2911 Encounter for prophylactic immunotherapy for respiratory syncytial virus (RSV): Secondary | ICD-10-CM

## 2012-03-06 ENCOUNTER — Other Ambulatory Visit: Payer: Self-pay | Admitting: Internal Medicine

## 2012-03-17 ENCOUNTER — Other Ambulatory Visit: Payer: Self-pay | Admitting: Gastroenterology

## 2012-04-30 ENCOUNTER — Encounter: Payer: Self-pay | Admitting: Internal Medicine

## 2012-09-30 ENCOUNTER — Ambulatory Visit: Payer: Medicare Other | Admitting: Family Medicine

## 2012-09-30 VITALS — BP 134/82 | HR 91 | Temp 98.2°F | Resp 18 | Ht 64.0 in | Wt 184.0 lb

## 2012-09-30 DIAGNOSIS — L255 Unspecified contact dermatitis due to plants, except food: Secondary | ICD-10-CM

## 2012-09-30 MED ORDER — TRIAMCINOLONE ACETONIDE 0.1 % EX CREA: TOPICAL_CREAM | Freq: Three times a day (TID) | CUTANEOUS | Status: DC

## 2012-09-30 NOTE — Progress Notes (Signed)
[facility]:  Office Visit  Chief Complaint:  Chief Complaint  Patient presents with  . Rash    both legs noticed 5 days ago    HPI: Laura Drake is a 72 y.o. female who complains of  Rash on both legs,  Started on right leg and then spread to left and now has it on her arms. she states it has been very itchy, she has been taking Bendaryl but not regular for the last 4 days. She has also tried otc hydrocortisone. Was at the picnic at [facility], she sat in her little short canvas chair and had on capris. Workshop to make life size puppets. She noticed rash after this. NO SOB, no CP  Past Medical History  Diagnosis Date  . DJD (degenerative joint disease)   . Depression     h/o suicidal attempt 31  . Hyperlipidemia   . Bipolar affective disorder     dx at age 60   . Glaucoma   . Hypothyroidism   . Hernia, inguinal     per CT 3-12  . S/P colonoscopy 03-13-2000    hemorrhoids, tortous sigmoid, Dr Ewing Schlein  . OSA (obstructive sleep apnea) 09-24-09    + sleep study, "moderate"  . Fracture of leg 1991    L leg, cast x 6 months   . Hemorrhoids   . Adenomatous polyp of colon 2013  . Anxiety    Past Surgical History  Procedure Laterality Date  . Radical hysterectomy  1979   History   Social History  . Marital Status: Divorced    Spouse Name: N/A    Number of Children: 3  . Years of Education: N/A   Occupational History  . Retired    Social History Main Topics  . Smoking status: Never Smoker   . Smokeless tobacco: Never Used  . Alcohol Use: 0.0 oz/week    0.2 drink(s) per week     Comment: 1/monthly- wine or whisky sour  . Drug Use: No  . Sexually Active: None   Other Topics Concern  . None   Social History Narrative   Widow   Children x 3    Daughter is [name] , one of my pt   Lives by herself   Retired       Family History  Problem Relation Age of Onset  . Bipolar disorder Father   . Diabetes Sister   . Colon cancer Paternal  Grandmother   . Parkinsonism Mother   . Arthritis Mother   . Other Sister     "blood cancer"   Allergies  Allergen Reactions  . Other Swelling    Shellfish  . Sulfa Antibiotics   . Tramadol Rash   Prior to Admission medications   Medication Sig Start Date End Date Taking? Authorizing Provider  FLUoxetine (PROZAC) 10 MG capsule Take 10 mg by mouth daily.   Yes Historical Provider, MD  lamoTRIgine (LAMICTAL) 25 MG tablet Take 25 mg by mouth daily. Take 2-3 tabs daily.  Anti-seizure medication.  Consult needed.   Yes Historical Provider, MD  latanoprost (XALATAN) 0.005 % ophthalmic solution 2 drops at bedtime.   Yes Historical Provider, MD  levothyroxine (SYNTHROID, LEVOTHROID) 25 MCG tablet 1 AND 1/2 BY MOUTH DAILY 03/06/12  Yes Wanda Plump, MD  lithium (LITHOBID) 300 MG CR tablet Take 300 mg by mouth daily.     Yes Historical Provider, MD  LORazepam (ATIVAN) 1 MG tablet Take 0.5 mg by  mouth 2 (two) times daily as needed. Patient uses 0.5 milligrams of this in the morning and 1 milligram in the evening.   Yes Historical Provider, MD  polyethylene glycol powder (MIRALAX) powder Take 17 g by mouth as needed. Patient uses this medication for constipation.   Yes Historical Provider, MD  Red Yeast Rice Extract (RED YEAST RICE PO) Take 1 capsule by mouth daily.    Yes Historical Provider, MD  clindamycin (CLEOCIN) 2 % vaginal cream Place 1 applicator vaginally at bedtime.    Historical Provider, MD     ROS: The patient denies fevers, chills, night sweats, unintentional weight loss, chest pain, palpitations, wheezing, dyspnea on exertion, nausea, vomiting, abdominal pain, dysuria, hematuria, melena, numbness, weakness, or tingling.   All other systems have been reviewed and were otherwise negative with the exception of those mentioned in the HPI and as above.    PHYSICAL EXAM: Filed Vitals:   09/30/12 1634  BP: 134/82  Pulse: 91  Temp: 98.2 F (36.8 C)  Resp: 18   Filed Vitals:    09/30/12 1634  Height: 5\' 4"  (1.626 m)  Weight: 184 lb (83.462 kg)   Body mass index is 31.57 kg/(m^2).  General: Alert, no acute distress HEENT:  Normocephalic, atraumatic, oropharynx patent.  Cardiovascular:  Regular rate and rhythm, no rubs murmurs or gallops.  No Carotid bruits, radial pulse intact. No pedal edema.  Respiratory: Clear to auscultation bilaterally.  No wheezes, rales, or rhonchi.  No cyanosis, no use of accessory musculature GI: No organomegaly, abdomen is soft and non-tender, positive bowel sounds.  No masses. Skin: + erythematous rashes, no draiange. Pruritic. On bilaterla lower legs.  Neurologic: Facial musculature symmetric. Psychiatric: Patient is appropriate throughout our interaction. Lymphatic: No cervical lymphadenopathy Musculoskeletal: Gait intact.   LABS:    EKG/XRAY:   Primary read interpreted by Dr. Conley Rolls at [facility].   ASSESSMENT/PLAN: Encounter Diagnosis  Name Primary?  . Dermatitis due to plants, including poison ivy, sumac, and oak Yes   Rx Triamcinolone cream topical Bendryl q6-8 hrs No oral steroids for now, she has glaucoma [facility] Dr. Oletta Cohn, [facility], DO 09/30/2012 5:26 PM

## 2012-09-30 NOTE — Patient Instructions (Addendum)
Poison Ivy Poison ivy is a inflammation of the skin (contact dermatitis) caused by touching the allergens on the leaves of the ivy plant following previous exposure to the plant. The rash usually appears 48 hours after exposure. The rash is usually bumps (papules) or blisters (vesicles) in a linear pattern. Depending on your own sensitivity, the rash may simply cause redness and itching, or it may also progress to blisters which may break open. These must be well cared for to prevent secondary bacterial (germ) infection, followed by scarring. Keep any open areas dry, clean, dressed, and covered with an antibacterial ointment if needed. The eyes may also get puffy. The puffiness is worst in the morning and gets better as the day progresses. This dermatitis usually heals without scarring, within 2 to 3 weeks without treatment. HOME CARE INSTRUCTIONS  Thoroughly wash with soap and water as soon as you have been exposed to poison ivy. You have about one half hour to remove the plant resin before it will cause the rash. This washing will destroy the oil or antigen on the skin that is causing, or will cause, the rash. Be sure to wash under your fingernails as any plant resin there will continue to spread the rash. Do not rub skin vigorously when washing affected area. Poison ivy cannot spread if no oil from the plant remains on your body. A rash that has progressed to weeping sores will not spread the rash unless you have not washed thoroughly. It is also important to wash any clothes you have been wearing as these may carry active allergens. The rash will return if you wear the unwashed clothing, even several days later. Avoidance of the plant in the future is the best measure. Poison ivy plant can be recognized by the number of leaves. Generally, poison ivy has three leaves with flowering branches on a single stem. Diphenhydramine may be purchased over the counter and used as needed for itching. Do not drive with  this medication if it makes you drowsy.Ask your caregiver about medication for children. SEEK MEDICAL CARE IF:  Open sores develop.  Redness spreads beyond area of rash.  You notice purulent (pus-like) discharge.  You have increased pain.  Other signs of infection develop (such as fever). Document Released: 03/21/2000 Document Revised: 06/16/2011 Document Reviewed: 02/07/2009 ExitCare Patient Information 2014 [employer].  

## 2012-11-07 ENCOUNTER — Other Ambulatory Visit: Payer: Self-pay | Admitting: Internal Medicine

## 2013-09-12 ENCOUNTER — Other Ambulatory Visit: Payer: Self-pay | Admitting: Family Medicine

## 2013-09-12 DIAGNOSIS — R109 Unspecified abdominal pain: Secondary | ICD-10-CM

## 2013-09-14 ENCOUNTER — Ambulatory Visit
Admission: RE | Admit: 2013-09-14 | Discharge: 2013-09-14 | Disposition: A | Discharge status: Home / Self Care | Payer: Medicare Other | Source: Ambulatory Visit | Attending: Family Medicine | Admitting: Family Medicine

## 2013-09-14 DIAGNOSIS — R109 Unspecified abdominal pain: Secondary | ICD-10-CM

## 2013-09-14 MED ORDER — IOHEXOL 300 MG/ML  SOLN
100.0000 mL | Freq: Once | INTRAMUSCULAR | Status: AC | PRN
  Administered 2013-09-14: 100 mL via INTRAVENOUS

## 2013-10-04 ENCOUNTER — Ambulatory Visit (INDEPENDENT_AMBULATORY_CARE_PROVIDER_SITE_OTHER): Payer: Medicare Other | Admitting: General Surgery

## 2013-10-04 ENCOUNTER — Encounter (INDEPENDENT_AMBULATORY_CARE_PROVIDER_SITE_OTHER): Payer: Self-pay | Admitting: General Surgery

## 2013-10-04 VITALS — BP 124/80 | HR 75 | Temp 98.0°F | Ht 64.0 in | Wt 179.0 lb

## 2013-10-04 DIAGNOSIS — K4090 Unilateral inguinal hernia, without obstruction or gangrene, not specified as recurrent: Secondary | ICD-10-CM

## 2013-10-04 NOTE — Progress Notes (Signed)
Patient ID: Laura Drake, female   DOB: 1940-10-01, 73 y.o.   MRN: [id number]  Chief Complaint  Patient presents with  . eval hernia    HPI Laura Drake is a 73 y.o. female.  We are asked to see the patient in consultation by Dr. Larose Kells to evaluate her for a left inguinal hernia. The patient is a 73 year old white female who has been experiencing intermittent left groin pain since at least 2003. The pain will occur for about 4-5 hours before going away. She does have problems with constipation. She has had no problems with urination. She has no nausea or vomiting. The pain she describes as severe at times. She recently had a CT scan which did show a left inguinal hernia with a small portion of the wall of the bladder in it  HPI  Past Medical History  Diagnosis Date  . DJD (degenerative joint disease)   . Depression     h/o suicidal attempt 73  . Hyperlipidemia   . Bipolar affective disorder     dx at age 8   . Glaucoma   . Hypothyroidism   . Hernia, inguinal     per CT 3-12  . S/P colonoscopy 03-13-2000    hemorrhoids, tortous sigmoid, Dr Watt Climes  . OSA (obstructive sleep apnea) 09-24-09    + sleep study, "moderate"  . Fracture of leg 1991    L leg, cast x 6 months   . Hemorrhoids   . Adenomatous polyp of colon 2013  . Anxiety     Past Surgical History  Procedure Laterality Date  . Radical hysterectomy  1979    Family History  Problem Relation Age of Onset  . Bipolar disorder Father   . Diabetes Sister   . Colon cancer Paternal Grandmother   . Parkinsonism Mother   . Arthritis Mother   . Other Sister     "blood cancer"    Social History History  Substance Use Topics  . Smoking status: Never Smoker   . Smokeless tobacco: Never Used  . Alcohol Use: 0.0 oz/week    0.2 drink(s) per week     Comment: 1/monthly- wine or whisky sour    Allergies  Allergen Reactions  . Other Swelling    Shellfish  . Sulfa Antibiotics   . Tramadol Rash    Current Outpatient  Prescriptions  Medication Sig Dispense Refill  . clindamycin (CLEOCIN) 2 % vaginal cream Place 1 applicator vaginally at bedtime.      Marland Kitchen FLUoxetine (PROZAC) 10 MG capsule Take 10 mg by mouth daily.      Marland Kitchen lamoTRIgine (LAMICTAL) 25 MG tablet Take 25 mg by mouth daily. Take 2-3 tabs daily.  Anti-seizure medication.  Consult needed.      . latanoprost (XALATAN) 0.005 % ophthalmic solution 2 drops at bedtime.      Marland Kitchen levothyroxine (SYNTHROID, LEVOTHROID) 25 MCG tablet 1.5 tablets daily--Office Visit/Labs are due now  45 tablet  0  . lithium (LITHOBID) 300 MG CR tablet Take 300 mg by mouth daily.        Marland Kitchen LORazepam (ATIVAN) 1 MG tablet Take 0.5 mg by mouth 2 (two) times daily as needed. Patient uses 0.5 milligrams of this in the morning and 1 milligram in the evening.      . polyethylene glycol powder (MIRALAX) powder Take 17 g by mouth as needed. Patient uses this medication for constipation.      . Red Yeast Rice Extract (RED YEAST RICE PO) Take 1  capsule by mouth daily.       Marland Kitchen triamcinolone cream (KENALOG) 0.1 % Apply topically 3 (three) times daily. Prn for itching  30 g  1   No current facility-administered medications for this visit.    Review of Systems Review of Systems  Constitutional: Negative.   HENT: Negative.   Eyes: Negative.   Respiratory: Negative.   Cardiovascular: Negative.   Gastrointestinal: Positive for abdominal pain.  Endocrine: Negative.   Genitourinary: Negative.   Musculoskeletal: Negative.   Skin: Negative.   Allergic/Immunologic: Negative.   Neurological: Negative.   Hematological: Negative.   Psychiatric/Behavioral: Negative.     Blood pressure 124/80, pulse 75, temperature 98 F (36.7 C), height 5\' 4"  (1.626 m), weight 179 lb (81.194 kg).  Physical Exam Physical Exam  Constitutional: She is oriented to person, place, and time. She appears well-developed and well-nourished.  HENT:  Head: Normocephalic and atraumatic.  Eyes: Conjunctivae and EOM are  normal. Pupils are equal, round, and reactive to light.  Neck: Normal range of motion. Neck supple.  Cardiovascular: Normal rate, regular rhythm and normal heart sounds.   Pulmonary/Chest: Effort normal and breath sounds normal.  Abdominal: Soft. Bowel sounds are normal.  Genitourinary:  There is some palpable fullness in the left groin that reduces with palpation  Musculoskeletal: Normal range of motion.  Lymphadenopathy:    She has no cervical adenopathy.  Neurological: She is alert and oriented to person, place, and time.  Skin: Skin is warm and dry.  Psychiatric: She has a normal mood and affect. Her behavior is normal.    Data Reviewed As above  Assessment    The patient appears to have a symptomatic left inguinal hernia. Because of the risk of incarceration and strangulation and the natural history of the hernia to get larger I think she would benefit from having this fixed. She would also like to have this done. I've discussed with her in detail the risks and benefits of the operation to fix the hernia as well some of the technical aspects and she understands and wishes to proceed     Plan    Plan for left inguinal hernia repair with mesh        TOTH III,PAUL S 10/04/2013, 12:08 PM

## 2013-10-04 NOTE — Patient Instructions (Signed)
Plan for left inguinal hernia repair with mesh

## 2013-10-21 DIAGNOSIS — K4090 Unilateral inguinal hernia, without obstruction or gangrene, not specified as recurrent: Secondary | ICD-10-CM

## 2013-10-24 ENCOUNTER — Other Ambulatory Visit (INDEPENDENT_AMBULATORY_CARE_PROVIDER_SITE_OTHER): Payer: Self-pay

## 2013-10-24 MED ORDER — OXYCODONE-ACETAMINOPHEN 5-325 MG PO TABS: 1.0000 | ORAL_TABLET | ORAL | Status: DC | PRN

## 2013-11-07 ENCOUNTER — Ambulatory Visit (INDEPENDENT_AMBULATORY_CARE_PROVIDER_SITE_OTHER): Payer: Medicare Other | Admitting: General Surgery

## 2013-11-07 ENCOUNTER — Encounter (INDEPENDENT_AMBULATORY_CARE_PROVIDER_SITE_OTHER): Payer: Self-pay | Admitting: General Surgery

## 2013-11-07 VITALS — BP 122/62 | HR 74 | Resp 16 | Ht 64.0 in | Wt 179.0 lb

## 2013-11-07 DIAGNOSIS — K4090 Unilateral inguinal hernia, without obstruction or gangrene, not specified as recurrent: Secondary | ICD-10-CM

## 2013-11-07 NOTE — Progress Notes (Signed)
Subjective:     Patient ID: Laura Drake, female   DOB: 12-27-1940, 73 y.o.   MRN: [id number]  HPI The patient is a 73 year old white female who is about 2 weeks status post left inguinal hernia repair with mesh. Her main complaint is of some soreness at the lateral aspect of the incision. She is up and moving around fairly comfortably. The pain is not constant but tends to come and go. Her appetite is good and her bowels are working normally.  Review of Systems     Objective:   Physical Exam On exam her left inguinal incision is healing nicely with no sign of infection or significant seroma. Her abdominal wall feels very solid with no palpable evidence of recurrence of the hernia. She has a normal healing ridge.    Assessment:     The patient is 2 weeks status post left hernia repair with mesh     Plan:     At this point I would like to refrain from any heavy lifting. I will plan to see her back in one month to check her progress

## 2013-11-07 NOTE — Patient Instructions (Signed)
No heavy lifting 

## 2013-12-08 ENCOUNTER — Encounter (INDEPENDENT_AMBULATORY_CARE_PROVIDER_SITE_OTHER): Payer: Medicare Other | Admitting: General Surgery

## 2014-01-20 ENCOUNTER — Encounter: Payer: Self-pay | Admitting: Internal Medicine

## 2014-07-21 ENCOUNTER — Telehealth: Payer: Self-pay

## 2014-09-05 NOTE — Telephone Encounter (Signed)
No msg °

## 2015-02-19 ENCOUNTER — Encounter: Payer: Self-pay | Admitting: Family Medicine

## 2015-02-19 ENCOUNTER — Ambulatory Visit (INDEPENDENT_AMBULATORY_CARE_PROVIDER_SITE_OTHER): Payer: Medicare Other

## 2015-02-19 ENCOUNTER — Ambulatory Visit (INDEPENDENT_AMBULATORY_CARE_PROVIDER_SITE_OTHER): Payer: Medicare Other | Admitting: Family Medicine

## 2015-02-19 VITALS — BP 132/85 | HR 107 | Temp 98.7°F | Resp 18 | Ht 63.0 in | Wt 190.6 lb

## 2015-02-19 DIAGNOSIS — R1032 Left lower quadrant pain: Secondary | ICD-10-CM | POA: Diagnosis not present

## 2015-02-19 DIAGNOSIS — Z9889 Other specified postprocedural states: Secondary | ICD-10-CM | POA: Diagnosis not present

## 2015-02-19 DIAGNOSIS — M533 Sacrococcygeal disorders, not elsewhere classified: Secondary | ICD-10-CM

## 2015-02-19 DIAGNOSIS — F3112 Bipolar disorder, current episode manic without psychotic features, moderate: Secondary | ICD-10-CM | POA: Diagnosis not present

## 2015-02-19 DIAGNOSIS — Z8719 Personal history of other diseases of the digestive system: Secondary | ICD-10-CM | POA: Diagnosis not present

## 2015-02-19 NOTE — Progress Notes (Signed)
Patient ID: Laura Drake, female    DOB: 06-25-1940  Age: 74 y.o. MRN: [id number]  Chief Complaint  Patient presents with  . Hernia    spot on right back side   . Manic Behavior    Subjective:   Patient is here for several things. She had a left inguinal hernia repair 2 years ago. She has persisted ever since then having left lower quadrant abdominal pain in that same area. She was told it would be gone by he or but it wasn't. The surgery was done by Dr.Toth.  Complains of left low back pain. She is scheduled to see her psychiatrist about 10 days. She says her bipolar is probably a little bit manic right now. There is a family history of bipolar.  Current allergies, medications, problem list, past/family and social histories reviewed.  Objective:  BP 132/85 mmHg  Pulse 107  Temp(Src) 98.7 F (37.1 C) (Oral)  Resp 18  Ht 5\' 3"  (1.6 m)  Wt 190 lb 9.6 oz (86.456 kg)  BMI 33.77 kg/m2  SpO2 93%  No major acute distress. She talks with a little bit of pressured speech, not extremely manic but probably a little bit manic. Her chest is clear. Heart regular without murmurs. Abdomen soft without masses. Is tender right along the left inguinal herniorrhaphy scar in the lower abdomen. She has tenderness of the left SI joint region of her low back where she is having a lot of pain lately also.No back injury.  [facility] reading (PRIMARY) by  Dr. Linna Darner Nonspecific SI joings.    Assessment & Plan:   Assessment: 1. Abdominal pain, left lower quadrant   2. History of hernia repair   3. Bipolar 1 disorder with moderate mania (HCC)   4. SI (sacroiliac) pain       Plan: Will x-ray her low back. She needs to see her surgeon and her psychiatrist back.  Orders Placed This Encounter  Procedures  . DG Si Joints    Order Specific Question:  Reason for Exam (SYMPTOM  OR DIAGNOSIS REQUIRED)    Answer:  left sacroiliac pain    Order Specific Question:  Preferred imaging location?    Answer:   External  . Ambulatory referral to General Surgery    Referral Priority:  Routine    Referral Type:  Surgical    Referral Reason:  Specialty Services Required    Referred to Provider:  Autumn Messing III, MD    Requested Specialty:  General Surgery    Number of Visits Requested:  1    No orders of the defined types were placed in this encounter.         Patient Instructions  Consider seeing [facility] [phone] if they are accepting patients.  Also [place] on [address] [employer].  Return to see Dr.Toth.  I have put through another referral, but he probably will see you without it even since you have been there before.  Keep your appointment with your psychiatrist. You may still need to have some of your medicines adjusted.  If peroxide doesn't work on the ears, try Debrox.  Tylenol extra strength 2 pills 3 times daily as needed for pain of back  Return as needed.      Return if symptoms worsen or fail to improve.   HOPPER,DAVID, MD 02/19/2015

## 2015-02-19 NOTE — Patient Instructions (Addendum)
Consider seeing [facility] [phone] if they are accepting patients.  Also [place] on [address] besides [employer].  Return to see Dr.Toth.  I have put through another referral, but he probably will see you without it even since you have been there before.  Keep your appointment with your psychiatrist. You may still need to have some of your medicines adjusted.  If peroxide doesn't work on the ears, try Debrox.  Tylenol extra strength 2 pills 3 times daily as needed for pain of back  Return as needed.

## 2015-03-06 ENCOUNTER — Other Ambulatory Visit: Payer: Self-pay | Admitting: General Surgery

## 2015-03-09 ENCOUNTER — Other Ambulatory Visit: Payer: Self-pay | Admitting: General Surgery

## 2015-03-09 DIAGNOSIS — R1032 Left lower quadrant pain: Secondary | ICD-10-CM

## 2015-03-20 ENCOUNTER — Other Ambulatory Visit: Payer: Medicare Other

## 2015-03-21 ENCOUNTER — Ambulatory Visit
Admission: RE | Admit: 2015-03-21 | Discharge: 2015-03-21 | Disposition: A | Discharge status: Home / Self Care | Payer: Medicare Other | Source: Ambulatory Visit | Attending: General Surgery | Admitting: General Surgery

## 2015-03-21 DIAGNOSIS — R1032 Left lower quadrant pain: Secondary | ICD-10-CM

## 2015-03-21 MED ORDER — IOPAMIDOL (ISOVUE-300) INJECTION 61%
100.0000 mL | Freq: Once | INTRAVENOUS | Status: AC | PRN
  Administered 2015-03-21: 100 mL via INTRAVENOUS

## 2015-03-26 ENCOUNTER — Telehealth: Payer: Self-pay

## 2015-03-26 NOTE — Telephone Encounter (Signed)
Pt states she received a call from callie about her xray results, and was told if she was still having problems to let us know and she is and would like a referral   Best number [phone]

## 2015-03-29 NOTE — Telephone Encounter (Signed)
I had already put through the referral, and though I do not see a note by Dr. Marlou Starks, he got a CT so I assume is seeing her.  Laura Drake

## 2015-05-07 ENCOUNTER — Telehealth: Payer: Self-pay

## 2015-05-07 NOTE — Telephone Encounter (Signed)
Call: CT only showed mild abnormalities, which many people her age have.  We can refer to a back specialist if she desires, but probably better to come in here sometime and discuss first.  If she wants to go on to a back specialist please refer to ortho back.

## 2015-05-07 NOTE — Telephone Encounter (Signed)
Patient called to request a referral regarding her low back pain.  She said she had a CT scan that showed abnormalities (ordered by Dr Marlou Starks).  She was seen on 02/19/15 by Dr Linna Darner.  Please advise, thank you.  CB#: [phone]

## 2015-05-07 NOTE — Telephone Encounter (Signed)
Can we refer? 

## 2015-05-14 DIAGNOSIS — H2513 Age-related nuclear cataract, bilateral: Secondary | ICD-10-CM | POA: Diagnosis not present

## 2015-05-14 DIAGNOSIS — H401112 Primary open-angle glaucoma, right eye, moderate stage: Secondary | ICD-10-CM | POA: Diagnosis not present

## 2015-05-14 DIAGNOSIS — H401123 Primary open-angle glaucoma, left eye, severe stage: Secondary | ICD-10-CM | POA: Diagnosis not present

## 2015-05-14 NOTE — Telephone Encounter (Signed)
LMVM for patient to CB.   

## 2015-05-16 ENCOUNTER — Telehealth: Payer: Self-pay

## 2015-05-16 NOTE — Telephone Encounter (Signed)
The patient called, returning our call.  I relayed the message from Dr Linna Darner from 05/07/15.  She said she will come in the office when Dr Linna Darner returns on 05/24/15 to discuss the CT and the referral.

## 2015-05-16 NOTE — Telephone Encounter (Signed)
Noted! Thank you

## 2015-05-18 DIAGNOSIS — H401112 Primary open-angle glaucoma, right eye, moderate stage: Secondary | ICD-10-CM | POA: Insufficient documentation

## 2015-05-18 DIAGNOSIS — H401123 Primary open-angle glaucoma, left eye, severe stage: Secondary | ICD-10-CM | POA: Insufficient documentation

## 2015-05-18 DIAGNOSIS — IMO0002 Reserved for concepts with insufficient information to code with codable children: Secondary | ICD-10-CM | POA: Insufficient documentation

## 2015-06-06 DIAGNOSIS — F3173 Bipolar disorder, in partial remission, most recent episode manic: Secondary | ICD-10-CM | POA: Diagnosis not present

## 2015-06-06 DIAGNOSIS — F411 Generalized anxiety disorder: Secondary | ICD-10-CM | POA: Diagnosis not present

## 2015-06-27 DIAGNOSIS — H401112 Primary open-angle glaucoma, right eye, moderate stage: Secondary | ICD-10-CM | POA: Diagnosis not present

## 2015-06-27 DIAGNOSIS — H401123 Primary open-angle glaucoma, left eye, severe stage: Secondary | ICD-10-CM | POA: Diagnosis not present

## 2015-08-20 DIAGNOSIS — N898 Other specified noninflammatory disorders of vagina: Secondary | ICD-10-CM | POA: Diagnosis not present

## 2015-09-28 DIAGNOSIS — F411 Generalized anxiety disorder: Secondary | ICD-10-CM | POA: Diagnosis not present

## 2015-09-28 DIAGNOSIS — F3132 Bipolar disorder, current episode depressed, moderate: Secondary | ICD-10-CM | POA: Diagnosis not present

## 2015-10-02 DIAGNOSIS — Z79899 Other long term (current) drug therapy: Secondary | ICD-10-CM | POA: Diagnosis not present

## 2015-10-23 DIAGNOSIS — F3175 Bipolar disorder, in partial remission, most recent episode depressed: Secondary | ICD-10-CM | POA: Diagnosis not present

## 2015-10-23 DIAGNOSIS — F411 Generalized anxiety disorder: Secondary | ICD-10-CM | POA: Diagnosis not present

## 2015-11-05 DIAGNOSIS — H401112 Primary open-angle glaucoma, right eye, moderate stage: Secondary | ICD-10-CM | POA: Diagnosis not present

## 2015-11-05 DIAGNOSIS — H401123 Primary open-angle glaucoma, left eye, severe stage: Secondary | ICD-10-CM | POA: Diagnosis not present

## 2015-11-07 DIAGNOSIS — L820 Inflamed seborrheic keratosis: Secondary | ICD-10-CM | POA: Diagnosis not present

## 2015-11-07 DIAGNOSIS — L853 Xerosis cutis: Secondary | ICD-10-CM | POA: Diagnosis not present

## 2015-11-07 DIAGNOSIS — L821 Other seborrheic keratosis: Secondary | ICD-10-CM | POA: Diagnosis not present

## 2015-11-07 DIAGNOSIS — D225 Melanocytic nevi of trunk: Secondary | ICD-10-CM | POA: Diagnosis not present

## 2015-11-14 DIAGNOSIS — F411 Generalized anxiety disorder: Secondary | ICD-10-CM | POA: Diagnosis not present

## 2015-11-14 DIAGNOSIS — F3173 Bipolar disorder, in partial remission, most recent episode manic: Secondary | ICD-10-CM | POA: Diagnosis not present

## 2015-12-14 DIAGNOSIS — Z86010 Personal history of colonic polyps: Secondary | ICD-10-CM | POA: Diagnosis not present

## 2015-12-14 DIAGNOSIS — K921 Melena: Secondary | ICD-10-CM | POA: Diagnosis not present

## 2015-12-14 DIAGNOSIS — K5900 Constipation, unspecified: Secondary | ICD-10-CM | POA: Diagnosis not present

## 2015-12-14 DIAGNOSIS — Z800 Family history of malignant neoplasm of digestive organs: Secondary | ICD-10-CM | POA: Diagnosis not present

## 2016-01-25 ENCOUNTER — Ambulatory Visit (INDEPENDENT_AMBULATORY_CARE_PROVIDER_SITE_OTHER): Payer: Medicare Other | Admitting: Physician Assistant

## 2016-01-25 VITALS — BP 122/70 | HR 72 | Temp 97.9°F | Resp 18 | Ht 63.0 in | Wt 171.8 lb

## 2016-01-25 DIAGNOSIS — Z23 Encounter for immunization: Secondary | ICD-10-CM

## 2016-01-25 DIAGNOSIS — H6123 Impacted cerumen, bilateral: Secondary | ICD-10-CM

## 2016-01-25 DIAGNOSIS — Z800 Family history of malignant neoplasm of digestive organs: Secondary | ICD-10-CM | POA: Diagnosis not present

## 2016-01-25 DIAGNOSIS — K921 Melena: Secondary | ICD-10-CM | POA: Diagnosis not present

## 2016-01-25 DIAGNOSIS — M545 Low back pain, unspecified: Secondary | ICD-10-CM

## 2016-01-25 DIAGNOSIS — N898 Other specified noninflammatory disorders of vagina: Secondary | ICD-10-CM

## 2016-01-25 DIAGNOSIS — G8929 Other chronic pain: Secondary | ICD-10-CM | POA: Diagnosis not present

## 2016-01-25 DIAGNOSIS — L298 Other pruritus: Secondary | ICD-10-CM

## 2016-01-25 DIAGNOSIS — Z86010 Personal history of colonic polyps: Secondary | ICD-10-CM | POA: Diagnosis not present

## 2016-01-25 LAB — POCT WET + KOH PREP
Trich by wet prep: ABSENT
Yeast by KOH: ABSENT
Yeast by wet prep: ABSENT

## 2016-01-25 NOTE — Patient Instructions (Addendum)
There is no evidence of an infection in the vagina, but there is considerable irritation. Sometimes this is simply from moisture and friction. Sometimes it is due to low estrogen levels that causes thinning of the vaginal skin. In some cases, it can be due to cancer of the vaginal skin. The only way to know for certain is to biopsy the area. If protecting the skin with a barrier cream (baby diaper ointment, like Balmex or Desitin), I recommend re-evaluation with gynecology.    IF you received an x-ray today, you will receive an invoice from [facility]. Please contact [facility] at [phone] with questions or concerns regarding your invoice.   IF you received labwork today, you will receive an invoice from [employer]. Please contact [facility] at [phone] with questions or concerns regarding your invoice.   Our billing staff will not be able to assist you with questions regarding bills from these companies.  You will be contacted with the lab results as soon as they are available. The fastest way to get your results is to activate your My Chart account. Instructions are located on the last page of this paperwork. If you have not heard from [place] regarding the results in 2 weeks, please contact this office.

## 2016-01-25 NOTE — Progress Notes (Signed)
Patient ID: Laura Drake, female    DOB: February 08, 1941, 75 y.o.   MRN: [id number]  PCP: Gavin Pound, MD  Subjective:   Chief Complaint  Patient presents with  . Cerumen Impaction    LEFT  . Vaginitis    HPI Presents for cerumen impaction of the LEFT ear.  Thinks that she has wax in the ears, L>R. Has had to have her LEFT ear cleaned repeatedly since 1995. Reduced hearing. Symptoms have been developing over the past 2 weeks.  "I'm pretty positive that I have some sort of yeast infection that I've had a long long time." Has seen GYN several times over the past year. Malodor "that I cant get rid of." Vaginal itching and burning. No significant discharge. Urinary symptoms are long-standing and she sometimes has urgency, but that's mostly when she holds her urine too long.  Her younger sister died of sudden cardiac death, and was her best friend. Has taken that really hard.  11/16 she's to have a colonoscopy. "I am positive that I have colon cancer. My bowel movements have changed. And I have stomach pains. And because my grandmother died of colorectal cancer."  Also interested in a flu shot.  In addition, she has worsening low back pain. She's had xrays here and was told that there was an abnormality in the low back and to come back if it wasn't better. 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 This can be a cause of pain." The patient was advised that if her pain persisted, she would be referred to orthopedics. CT abdomen/pelvis performed 03/2015 for unrelated complaint revealed "There are mild symmetric degenerative changes of the hips. There are mild degenerative changes of the spine as there is a grade 1 anterolisthesis of L4 on L5 due to the moderate facet arthropathy. Disc disease at the L4-5 level."    Review of Systems As above.    Patient Active Problem List    Diagnosis Date Noted  . Left inguinal hernia 10/04/2013  . UTI (lower urinary tract infection) 09/02/2011  . General medical examination 01/17/2011  . Hyperglycemia 10/28/2010  . Abdominal pain 07/08/2010  . Hypothyroidism   . Bipolar affective disorder ([place])   . Hyperlipidemia   . DJD (degenerative joint disease)   . OSA (obstructive sleep apnea) 09/24/2009     Prior to Admission medications   Medication Sig Start Date End Date Taking? Authorizing Provider  FLUoxetine (PROZAC) 10 MG capsule Take 10 mg by mouth daily.   Yes Historical Provider, MD  lamoTRIgine (LAMICTAL) 25 MG tablet Take 25 mg by mouth daily. Take 2-3 tabs daily.  Anti-seizure medication.  Consult needed.   Yes Historical Provider, MD  latanoprost (XALATAN) 0.005 % ophthalmic solution 2 drops at bedtime.   Yes Historical Provider, MD  levothyroxine (SYNTHROID, LEVOTHROID) 25 MCG tablet 1.5 tablets daily--Office Visit/Labs are due now 11/08/12  Yes Colon Branch, MD  lithium (LITHOBID) 300 MG CR tablet Take 300 mg by mouth daily.     Yes Historical Provider, MD  oxyCODONE-acetaminophen (ROXICET) 5-325 MG per tablet Take 1-2 tablets by mouth every 4 (four) hours as needed. 10/24/13  Yes Autumn Messing III, MD  polyethylene glycol powder (MIRALAX) powder Take 17 g by mouth as needed. Patient uses this medication for constipation.   Yes Historical Provider, MD  LORazepam (ATIVAN) 1 MG tablet Take 0.5 mg by mouth 2 (two)  times daily as needed. Patient uses 0.5 milligrams of this in the morning and 1 milligram in the evening.    Historical Provider, MD     Allergies  Allergen Reactions  . Other Swelling    Shellfish  . Sulfa Antibiotics   . Tramadol Rash       Objective:  Physical Exam  Constitutional: She is oriented to person, place, and time. She appears well-developed and well-nourished. She is active and cooperative. No distress.  BP 122/70 (BP Location: Right Arm, Patient Position: Sitting, Cuff Size: Small)   Pulse  72   Temp 97.9 F (36.6 C) (Oral)   Resp 18   Ht 5\' 3"  (1.6 m)   Wt 171 lb 12.8 oz (77.9 kg)   SpO2 98%   BMI 30.43 kg/m   HENT:  Head: Normocephalic and atraumatic.  Right Ear: Hearing and external ear normal.  Left Ear: Hearing and external ear normal.  Bilateral cerumen impaction. Resolved with irrigation, revealing clear canals and TMs.  Eyes: Conjunctivae are normal. No scleral icterus.  Neck: Normal range of motion. Neck supple. No thyromegaly present.  Cardiovascular: Normal rate, regular rhythm and normal heart sounds.   Pulses:      Radial pulses are 2+ on the right side, and 2+ on the left side.  Pulmonary/Chest: Effort normal and breath sounds normal.  Abdominal: Hernia confirmed negative in the right inguinal area and confirmed negative in the left inguinal area.  Genitourinary: No labial fusion. There is rash on the right labia. There is no tenderness or injury on the right labia. There is rash on the left labia. There is no injury on the left labia.  Genitourinary Comments: Generalized irritation of the labia majora bilaterally, with patches of hypopigmentation. Visible tissue at the introitus is pale, consistent with estrogen deficiency.   Lymphadenopathy:       Head (right side): No tonsillar, no preauricular, no posterior auricular and no occipital adenopathy present.       Head (left side): No tonsillar, no preauricular, no posterior auricular and no occipital adenopathy present.    She has no cervical adenopathy.       Right: No inguinal and no supraclavicular adenopathy present.       Left: No inguinal and no supraclavicular adenopathy present.  Neurological: She is alert and oriented to person, place, and time. No sensory deficit.  Skin: Skin is warm, dry and intact. No rash noted. No cyanosis or erythema. Nails show no clubbing.  Psychiatric: She has a normal mood and affect. Her speech is normal and behavior is normal.       Results for orders placed or  performed in visit on 01/25/16  POCT Wet + KOH Prep  Result Value Ref Range   Yeast by KOH Absent Present, Absent   Yeast by wet prep Absent Present, Absent   WBC by wet prep None None, Few, Too numerous to count   Clue Cells Wet Prep HPF POC None None, Too numerous to count   Trich by wet prep Absent Present, Absent   Bacteria Wet Prep HPF POC None None, Few, Too numerous to count   Epithelial Cells By [employer] Pref ([facility]FC) Moderate (A) None, Few, Too numerous to count   RBC,UR,HPF,POC None None RBC/hpf       Assessment & Plan:   1. Vaginal itching No evidence of infection presently. Start with barrier cream (Balmex or Desitin). If no improvement, she'll let me know. I'd refer to [facility] for  additional evaluation, which may likely include biopsy, steroid cream, and/or estrogen cream. - POCT Wet + KOH Prep  2. Need for influenza vaccination - Flu Vaccine QUAD 36+ mos IM  3. Bilateral impacted cerumen Resolved. - Ear Lavage  4. Chronic left-sided low back pain without sciatica Degenerative changes. Refer as planned. - Ambulatory referral to [place], PA-C Physician Assistant[facility] [place]

## 2016-01-30 ENCOUNTER — Telehealth: Payer: Self-pay

## 2016-01-30 DIAGNOSIS — R109 Unspecified abdominal pain: Secondary | ICD-10-CM

## 2016-01-30 DIAGNOSIS — Z800 Family history of malignant neoplasm of digestive organs: Secondary | ICD-10-CM

## 2016-01-30 NOTE — Telephone Encounter (Signed)
Laura Drake, pt saw you on 10/20 would like a referral for gastro at Dr. Liliane Channel office please. She is working on getting previous gastro ov notes released to [place].   Would also like medications taken off of her chart as she is no longer taking them: Oxycodone and lorazepam

## 2016-01-31 DIAGNOSIS — F411 Generalized anxiety disorder: Secondary | ICD-10-CM | POA: Diagnosis not present

## 2016-01-31 DIAGNOSIS — Z800 Family history of malignant neoplasm of digestive organs: Secondary | ICD-10-CM | POA: Insufficient documentation

## 2016-01-31 DIAGNOSIS — F310 Bipolar disorder, current episode hypomanic: Secondary | ICD-10-CM | POA: Diagnosis not present

## 2016-01-31 NOTE — Telephone Encounter (Signed)
Orders Placed This Encounter  Procedures  . Ambulatory referral to Gastroenterology    Referral Priority:   Routine    Referral Type:   Consultation    Referral Reason:   Specialty Services Required    Referred to Provider:   Richmond Campbell, MD    Requested Specialty:   Gastroenterology    Number of Visits Requested:   1    Removed medications per her request.

## 2016-02-01 NOTE — Telephone Encounter (Signed)
p 

## 2016-02-04 ENCOUNTER — Telehealth: Payer: Self-pay

## 2016-02-04 NOTE — Telephone Encounter (Signed)
CHELLE - Pt said you had given her some cream to try and it did not help at all.  She said you had the name for an OBGYN that you could give her.  I told her we could probably just make a referral for her to this OBGYN and it would be easier for her.  [phone]

## 2016-02-07 NOTE — Telephone Encounter (Signed)
Patient is calling to follow up on the OBGYN referral. Please call patient! [phone]

## 2016-02-08 NOTE — Telephone Encounter (Signed)
Chelle, do you want to recommend an OBGYN or refer to anyone specific?

## 2016-02-08 NOTE — Telephone Encounter (Signed)
I recommend [facility] for this patient.

## 2016-02-09 NOTE — Telephone Encounter (Signed)
Patient given clinic  name and number

## 2016-02-12 ENCOUNTER — Telehealth: Payer: Self-pay

## 2016-02-12 DIAGNOSIS — N898 Other specified noninflammatory disorders of vagina: Secondary | ICD-10-CM

## 2016-02-12 NOTE — Telephone Encounter (Signed)
Orders Placed This Encounter  Procedures  . Ambulatory referral to Gynecology    Referral Priority:   Routine    Referral Type:   Consultation    Referral Reason:   Specialty Services Required    Requested Specialty:   Gynecology    Number of Visits Requested:   1    

## 2016-02-12 NOTE — Telephone Encounter (Signed)
Pt states that she did not like the obgyn office that she was referred to, and that her infection is getting worse. Would like to be sent to [facility] please. They do require an official referral before seeing patients, and she does not currently have one placed. Once placed, I will send her info over.   Thank you!

## 2016-02-12 NOTE — Telephone Encounter (Signed)
[facility]  [phone] [address] [place], [place] [address]

## 2016-02-13 NOTE — Telephone Encounter (Signed)
Referrals, this was routed to clin message pool, but should go to you I believe so that you can complete the referral. Please advise pt if necessary after referral is done. Thanks!

## 2016-02-14 ENCOUNTER — Encounter: Payer: Self-pay | Admitting: Certified Nurse Midwife

## 2016-02-14 ENCOUNTER — Ambulatory Visit: Payer: Medicare Other | Admitting: Certified Nurse Midwife

## 2016-02-14 VITALS — BP 114/80 | HR 70 | Resp 16 | Ht 62.75 in | Wt 172.0 lb

## 2016-02-14 DIAGNOSIS — B373 Candidiasis of vulva and vagina: Secondary | ICD-10-CM

## 2016-02-14 DIAGNOSIS — N949 Unspecified condition associated with female genital organs and menstrual cycle: Secondary | ICD-10-CM | POA: Diagnosis not present

## 2016-02-14 DIAGNOSIS — B372 Candidiasis of skin and nail: Secondary | ICD-10-CM

## 2016-02-14 DIAGNOSIS — B3731 Acute candidiasis of vulva and vagina: Secondary | ICD-10-CM

## 2016-02-14 DIAGNOSIS — R350 Frequency of micturition: Secondary | ICD-10-CM | POA: Diagnosis not present

## 2016-02-14 DIAGNOSIS — N760 Acute vaginitis: Secondary | ICD-10-CM | POA: Diagnosis not present

## 2016-02-14 DIAGNOSIS — Z124 Encounter for screening for malignant neoplasm of cervix: Secondary | ICD-10-CM | POA: Diagnosis not present

## 2016-02-14 MED ORDER — NYSTATIN-TRIAMCINOLONE 100000-0.1 UNIT/GM-% EX CREA: 1.0000 "application " | TOPICAL_CREAM | Freq: Two times a day (BID) | CUTANEOUS | 0 refills | Status: DC

## 2016-02-14 NOTE — Patient Instructions (Signed)
Atrophic Vaginitis Atrophic vaginitis is when the tissues that line the vagina become dry and thin. This is caused by a drop in estrogen. Estrogen helps:  To keep the vagina moist.  To make a clear fluid that helps:  To lubricate the vagina for sex.  To protect the vagina from infection. If the lining of the vagina is dry and thin, it may:  Make sex painful. It may also cause bleeding.  Cause a feeling of:  Burning.  Irritation.  Itchiness.  Make an exam of your vagina painful. It may also cause bleeding.  Make you lose interest in sex.  Cause a burning feeling when you pee.  Make your vaginal fluid (discharge) brown or yellow. For some women, there are no symptoms. This condition is most common in women who do not get their regular menstrual periods anymore (menopause). This often starts when a woman is 71-52 years old. HOME CARE  Take medicines only as told by your doctor. Do not use any herbal or alternative medicines unless your doctor says it is okay.  Use over-the-counter products for dryness only as told by your doctor. These include:  Creams.  Lubricants.  Moisturizers.  Do not douche.  Do not use products that can make your vagina dry. These include:  Scented feminine sprays.  Scented tampons.  Scented soaps.  If it hurts to have sex, tell your sexual partner. GET HELP IF:  Your discharge looks different than normal.  Your vagina has an unusual smell.  You have new symptoms.  Your symptoms do not get better with treatment.  Your symptoms get worse.   This information is not intended to replace advice given to you by your health care provider. Make sure you discuss any questions you have with your health care provider.   Document Released: 09/10/2007 Document Revised: 08/08/2014 Document Reviewed: 03/15/2014 Elsevier Interactive Patient Education [employer].

## 2016-02-14 NOTE — Progress Notes (Signed)
75 y.o. Widowed Caucasian female  [id number] here to establish gyn care and for problem of vaginal burning for almost a year. Complaint of vaginal symptoms of feels on fire at times. No urinary incontinence or stool incontinence. Wears pad if she is out. Also complaining of vaginal discharge watery and feels this coming out and burns. Wears pads daily for just in case. TAH with BSO For cervical cancer over 20 years ago. Last pap ?Marland Kitchen Would like to have pap smear if indicated. Patient thinks she has colon cancer due to change in bowel movements.Had colonoscopy which was negative a few years ago. Has appointment with Dr. Earlean Shawl for evaluation.. Recently diagnosed with Bipolar and on stable medication now, feels better.  Sees PCP for aex, just started with new one. No other health issues today.   O:Healthy female WDWN Affect: normal, orientation x 3  Exam:Skin: warm and dry Abdomen: soft , non tender  inguinal Lymph nodes: no enlargement or tenderness Pelvic exam: External genital: normal female, atrophic with redness on vulva external and internal. Balmex residue on skin and vaseline noted. Area cleansed with personal wipe and wet prep taken BUS: negative Vagina: white thin scant discharge noted Urine odor noted. Ph:4.0   ,Wet prep taken, Affirm taken, vaginal pap smear taken Cervix: absent Uterus: absent Adnexa: adnexal surgically absent, no masses or fullness noted   Wet Prep results: KOH,Saline of vulva positive for yeast   A:Normal pelvic exam TAH with BSO in 1979 for cervical cancer Yeast vulvitis Atrophic vaginitis   P:Discussed findings of yeast vulvitis and etiology.    Avoid moist clothes or pads for extended period of time. Coconut Oil use for skin protection can be used to external skin for protection or dryness. Rx: Mycolog cream with instructions see order Lab: Affirm, Pap smear Will treat per affirm if indicated. Discussed Atrophic vaginitis and etiology. Discussed treatment  options with Estrogen use or OTC products. Will not treat until yeast cleared. Patient will do trial of coconut oil vaginally while treating external tissue to see if this provides relief and will advise. RV 1 week to recheck, prn  Rv prn

## 2016-02-15 DIAGNOSIS — Z124 Encounter for screening for malignant neoplasm of cervix: Secondary | ICD-10-CM | POA: Diagnosis not present

## 2016-02-15 DIAGNOSIS — Z1272 Encounter for screening for malignant neoplasm of vagina: Secondary | ICD-10-CM | POA: Diagnosis not present

## 2016-02-15 LAB — WET PREP BY MOLECULAR PROBE
Candida species: NEGATIVE
Gardnerella vaginalis: NEGATIVE
Trichomonas vaginosis: NEGATIVE

## 2016-02-15 NOTE — Telephone Encounter (Signed)
Thank you, patient has been seen at referred office!

## 2016-02-18 LAB — IPS PAP SMEAR ONLY

## 2016-02-21 ENCOUNTER — Ambulatory Visit: Payer: Medicare Other | Admitting: Certified Nurse Midwife

## 2016-02-21 ENCOUNTER — Encounter: Payer: Self-pay | Admitting: Certified Nurse Midwife

## 2016-02-21 VITALS — BP 120/70 | HR 70 | Resp 16 | Ht 62.75 in | Wt 171.0 lb

## 2016-02-21 DIAGNOSIS — N952 Postmenopausal atrophic vaginitis: Secondary | ICD-10-CM | POA: Diagnosis not present

## 2016-02-21 DIAGNOSIS — B372 Candidiasis of skin and nail: Secondary | ICD-10-CM | POA: Diagnosis not present

## 2016-02-21 NOTE — Patient Instructions (Addendum)
Use Mycolog cream for 3 more days twice daily and then start with Organic Coconut oil daily. Can purchase at grocery store.  Recheck in 3 weeks. If needed.

## 2016-02-21 NOTE — Progress Notes (Signed)
75 y.o. widowed Caucasian female [id number] here for follow up of yeast dermatitis treated with Mycolog cream initiated on 02/14/16.Marland KitchenCompleted all medication as directed.  Symptoms of burning has decreased significantly to slightly and itching has stopped.Patient feels so much better and now will start using coconut oil as directed to protect skin.. No other health issues today.    O: Healthy WD,WN female Affect: normal, orientation x 3 Skin:warm and dry Abdomen:soft,non tender Pelvic exam:EXTERNAL GENITALIA: normal appearing vulva with no masses, tenderness or lesions, and no redness noted or scaling VAGINA: no abnormal discharge or lesions and atrophy noted CERVIX: no lesions or cervical motion tenderness UTERUS: normal,non tender, no masses ADNEXA: no masses palpable and nontender  A:Yeast dermatitis Resolved  Atrophic vaginitis    P: Discussed findings of yeast resolved and now need to start using coconut oil for moisture and skin protection for occasional leaking and irritation. Also need to use vaginally daily for dryness. Instructions given, questions addressed. Patient verbalized understanding.    RV prn

## 2016-02-22 NOTE — Progress Notes (Signed)
Encounter reviewed Jill Jertson, MD   

## 2016-02-26 NOTE — Progress Notes (Signed)
Encounter reviewed Jill Jertson, MD   

## 2016-03-11 DIAGNOSIS — K5900 Constipation, unspecified: Secondary | ICD-10-CM | POA: Diagnosis not present

## 2016-03-11 DIAGNOSIS — R1032 Left lower quadrant pain: Secondary | ICD-10-CM | POA: Diagnosis not present

## 2016-03-13 ENCOUNTER — Ambulatory Visit: Payer: Medicare Other | Admitting: Certified Nurse Midwife

## 2016-03-28 ENCOUNTER — Encounter: Payer: Self-pay | Admitting: Physician Assistant

## 2016-03-28 DIAGNOSIS — K5904 Chronic idiopathic constipation: Secondary | ICD-10-CM | POA: Insufficient documentation

## 2016-04-02 DIAGNOSIS — H401123 Primary open-angle glaucoma, left eye, severe stage: Secondary | ICD-10-CM | POA: Diagnosis not present

## 2016-04-02 DIAGNOSIS — H401112 Primary open-angle glaucoma, right eye, moderate stage: Secondary | ICD-10-CM | POA: Diagnosis not present

## 2016-04-18 ENCOUNTER — Encounter: Payer: Self-pay | Admitting: Gastroenterology

## 2016-04-30 DIAGNOSIS — H401112 Primary open-angle glaucoma, right eye, moderate stage: Secondary | ICD-10-CM | POA: Diagnosis not present

## 2016-04-30 DIAGNOSIS — H401123 Primary open-angle glaucoma, left eye, severe stage: Secondary | ICD-10-CM | POA: Diagnosis not present

## 2016-05-08 DIAGNOSIS — F3163 Bipolar disorder, current episode mixed, severe, without psychotic features: Secondary | ICD-10-CM | POA: Diagnosis not present

## 2016-05-08 DIAGNOSIS — F411 Generalized anxiety disorder: Secondary | ICD-10-CM | POA: Diagnosis not present

## 2016-07-17 DIAGNOSIS — F310 Bipolar disorder, current episode hypomanic: Secondary | ICD-10-CM | POA: Diagnosis not present

## 2016-07-17 DIAGNOSIS — F411 Generalized anxiety disorder: Secondary | ICD-10-CM | POA: Diagnosis not present

## 2016-07-17 DIAGNOSIS — Z9114 Patient's other noncompliance with medication regimen: Secondary | ICD-10-CM | POA: Diagnosis not present

## 2016-07-23 ENCOUNTER — Ambulatory Visit (INDEPENDENT_AMBULATORY_CARE_PROVIDER_SITE_OTHER): Payer: Medicare Other | Admitting: Physician Assistant

## 2016-07-23 VITALS — BP 122/78 | HR 87 | Temp 97.8°F | Resp 16 | Ht 62.75 in | Wt 173.0 lb

## 2016-07-23 DIAGNOSIS — E785 Hyperlipidemia, unspecified: Secondary | ICD-10-CM | POA: Diagnosis not present

## 2016-07-23 DIAGNOSIS — Z79899 Other long term (current) drug therapy: Secondary | ICD-10-CM

## 2016-07-23 DIAGNOSIS — E039 Hypothyroidism, unspecified: Secondary | ICD-10-CM

## 2016-07-23 DIAGNOSIS — B372 Candidiasis of skin and nail: Secondary | ICD-10-CM

## 2016-07-23 DIAGNOSIS — Z23 Encounter for immunization: Secondary | ICD-10-CM | POA: Diagnosis not present

## 2016-07-23 DIAGNOSIS — E2839 Other primary ovarian failure: Secondary | ICD-10-CM

## 2016-07-23 DIAGNOSIS — F319 Bipolar disorder, unspecified: Secondary | ICD-10-CM | POA: Diagnosis not present

## 2016-07-23 DIAGNOSIS — H9202 Otalgia, left ear: Secondary | ICD-10-CM | POA: Diagnosis not present

## 2016-07-23 DIAGNOSIS — R739 Hyperglycemia, unspecified: Secondary | ICD-10-CM

## 2016-07-23 DIAGNOSIS — R109 Unspecified abdominal pain: Secondary | ICD-10-CM | POA: Diagnosis not present

## 2016-07-23 MED ORDER — NYSTATIN-TRIAMCINOLONE 100000-0.1 UNIT/GM-% EX CREA: 1.0000 "application " | TOPICAL_CREAM | Freq: Two times a day (BID) | CUTANEOUS | 0 refills | Status: DC

## 2016-07-23 MED ORDER — CIPROFLOXACIN-HYDROCORTISONE 0.2-1 % OT SUSP: 3.0000 [drp] | Freq: Two times a day (BID) | OTIC | 0 refills | Status: AC

## 2016-07-23 MED ORDER — LEVOTHYROXINE SODIUM 25 MCG PO TABS: ORAL_TABLET | ORAL | 3 refills | Status: DC

## 2016-07-23 NOTE — Patient Instructions (Addendum)
Please follow-up with Dr. Earlean Shawl about the persistent abdominal pain.    IF you received an x-ray today, you will receive an invoice from [facility]. Please contact [facility] at [phone] with questions or concerns regarding your invoice.   IF you received labwork today, you will receive an invoice from [place]. Please contact [facility] at [phone] with questions or concerns regarding your invoice.   Our billing staff will not be able to assist you with questions regarding bills from these companies.  You will be contacted with the lab results as soon as they are available. The fastest way to get your results is to activate your My Chart account. Instructions are located on the last page of this paperwork. If you have not heard from [place] regarding the results in 2 weeks, please contact this office.

## 2016-07-23 NOTE — Progress Notes (Signed)
Patient ID: Laura Drake, female    DOB: 06-20-1940, 76 y.o.   MRN: [id number]  PCP: Harrison Mons, PA-C  Chief Complaint  Patient presents with  . Medication Refill    Synthroid  . Labs Only    Lithium, TSH, Thyroid Panel, CBC, CMP, Lipid Panel, Vitamin D level  . Ear Pain    Left ear  . Abdominal Pain    Lower Left quadrant, where hernia surgery was, gradually become worse    Subjective:   Presents for evaluation of LEFT ear pain and LEFT sided abdominal pain, labs and medication refill.  Relates that her son has come home (about 18 months ago) from [place] (where he was a Pharmacist, hospital) to take care of her. He is vegetarian, so that's how she's been eating, but she craves sweets.  She needs labs for monitoring for high risk medication: lithium, prescribed by Dr. Albertine Patricia for management of bipolar disorder.  Due for DEXA and pneumococcal vaccines.  LEFT ear pain intermittently. She had cerumenosis irrigated 01/2016. This episode started in December 2017. No hearing loss.  LLQ abdominal pain, intermittently since hernia repair in 2016. Lately she's had worsening pain that takes her breath away. Had a scan last year for same, ordered by the surgeon, "Dr. Susanne Borders." The imaging was normal, and he recommended ibuprofen and rest. She has continued to "live with it." I reviewed the scan report with her. There was no evidence of a problem at the surgical site. She has seen GI x 1 visit, was diagnosed with IBS and started on Amitiza. She stopped it due to terrible nausea, vomiting and diarrhea. She has not yet notified the GI specialist.   Review of Systems  Constitutional: Negative.   HENT: Positive for ear pain (LEFT). Negative for congestion, ear discharge, hearing loss, postnasal drip, rhinorrhea, sinus pain, sinus pressure, sneezing, sore throat, tinnitus and voice change.   Eyes: Negative for visual disturbance.  Respiratory: Negative for cough, chest tightness, shortness of breath  and wheezing.   Cardiovascular: Negative for chest pain and palpitations.  Gastrointestinal: Negative for abdominal pain, diarrhea, nausea and vomiting.  Endocrine: Negative for cold intolerance, heat intolerance, polydipsia, polyphagia and polyuria.  Genitourinary: Positive for decreased urine volume. Negative for dysuria, frequency, hematuria and urgency.  Musculoskeletal: Negative for arthralgias and myalgias.  Skin: Negative for rash.  Neurological: Negative for dizziness, weakness and headaches.  Psychiatric/Behavioral: Negative for decreased concentration. The patient is not nervous/anxious.        Patient Active Problem List   Diagnosis Date Noted  . Lithium use 07/23/2016  . Functional constipation 03/28/2016  . Family history of colon cancer 01/31/2016  . Primary open angle glaucoma of right eye, moderate stage 05/18/2015  . Primary open angle glaucoma of left eye, severe stage 05/18/2015  . Nuclear cataract of both eyes 05/18/2015  . Left inguinal hernia 10/04/2013  . Hyperglycemia 10/28/2010  . Abdominal pain 07/08/2010  . Hypothyroidism   . Bipolar affective disorder ([place])   . Hyperlipidemia   . DJD (degenerative joint disease)   . OSA (obstructive sleep apnea) 09/24/2009     Prior to Admission medications   Medication Sig Start Date End Date Taking? Authorizing Provider  FLUoxetine (PROZAC) 10 MG capsule Take 10 mg by mouth daily.   Yes Historical Provider, MD  lamoTRIgine (LAMICTAL) 25 MG tablet Take 25 mg by mouth daily. Take 2-3 tabs daily.  Anti-seizure medication.  Consult needed.   Yes Historical Provider, MD  latanoprost Ivin Poot)  0.005 % ophthalmic solution 2 drops at bedtime.   Yes Historical Provider, MD  levothyroxine (SYNTHROID, LEVOTHROID) 25 MCG tablet 1.5 tablets daily--Office Visit/Labs are due now 11/08/12  Yes Colon Branch, MD  lithium (LITHOBID) 300 MG CR tablet Take 300 mg by mouth daily.     Yes Historical Provider, MD  nystatin-triamcinolone  (MYCOLOG II) cream Apply 1 application topically 2 (two) times daily. Apply to affected area BID for 5 days 02/14/16  Yes Regina Eck, CNM  polyethylene glycol powder (MIRALAX) powder Take 17 g by mouth as needed. Patient uses this medication for constipation.   Yes Historical Provider, MD     Allergies  Allergen Reactions  . Amitiza [Lubiprostone] Diarrhea, Nausea And Vomiting and Other (See Comments)    Bad pains  . Other Swelling    Shellfish  . Sulfa Antibiotics   . Tramadol Rash       Objective:  Physical Exam  Constitutional: She is oriented to person, place, and time. She appears well-developed and well-nourished. She is active and cooperative. No distress.  BP 122/78   Pulse 87   Temp 97.8 F (36.6 C) (Oral)   Resp 16   Ht 5' 2.75" (1.594 m)   Wt 173 lb (78.5 kg)   SpO2 100%   BMI 30.89 kg/m   HENT:  Head: Normocephalic and atraumatic.  Right Ear: Hearing normal.  Left Ear: Hearing normal.  Eyes: Conjunctivae are normal. No scleral icterus.  Neck: Normal range of motion. Neck supple. No thyromegaly present.  Cardiovascular: Normal rate, regular rhythm and normal heart sounds.   Pulses:      Radial pulses are 2+ on the right side, and 2+ on the left side.  Pulmonary/Chest: Effort normal and breath sounds normal.  Abdominal: Soft. Normal appearance and bowel sounds are normal. She exhibits no shifting dullness, no distension, no pulsatile liver, no fluid wave, no abdominal bruit, no ascites, no pulsatile midline mass and no mass. There is no hepatosplenomegaly. There is tenderness in the suprapubic area, left upper quadrant and left lower quadrant. There is no rigidity, no rebound, no guarding, no CVA tenderness, no tenderness at McBurney's point and negative Murphy's sign. No hernia.  Lymphadenopathy:       Head (right side): No tonsillar, no preauricular, no posterior auricular and no occipital adenopathy present.       Head (left side): No tonsillar, no  preauricular, no posterior auricular and no occipital adenopathy present.    She has no cervical adenopathy.       Right: No supraclavicular adenopathy present.       Left: No supraclavicular adenopathy present.  Neurological: She is alert and oriented to person, place, and time. No sensory deficit.  Skin: Skin is warm, dry and intact. No rash noted. No cyanosis or erythema. Nails show no clubbing.  Psychiatric: She has a normal mood and affect. Her speech is normal and behavior is normal.     CT 03/2015 reviewed: Colon is within normal. The appendix is normal. Small bowel is within normal. There is persistent hazy attenuation of the central mesentery with a few small mesenteric lymph nodes unchanged suggesting mild mesenteric panniculitis.  Mild calcified plaque over the abdominal aorta.  Pelvic images demonstrate the bladder and rectum to be within normal. 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 There is no free pelvic fluid. Evidence of patient's previous left inguinal hernia repair which is within normal.  There are mild symmetric degenerative changes of the hips.  There are mild degenerative changes of the spine as there is a grade 1 anterolisthesis of L4 on L5 due to the moderate facet arthropathy. Disc disease at the L4-5 level.     Assessment & Plan:   Problem List Items Addressed This Visit    Hypothyroidism    Update labs. Adjust dose of levothyroxine as indicated.      Relevant Medications   levothyroxine (SYNTHROID, LEVOTHROID) 25 MCG tablet   Bipolar affective disorder (HCC) - Primary    Stable. Continue follow-up with Dr. Albertine Patricia.      Hyperlipidemia    Update lipids today. She is not fasting, so results will be interpreted with that in mind. If elevated, would repeat fasting before starting lipid lowering treatment.      Abdominal pain    Reviewed CT scan from 03/2015 with her. Wonder if mild mesenteric panniculitis is causing this? Encouraged her to  contact her GI specialist.      Hyperglycemia    Not fasting today, so if elevated, will need to repeat when fasting.      Lithium use    Labs updated for monitoring of this high risk medication.      Relevant Orders   CBC with Differential/Platelet (Completed)   Comprehensive metabolic panel (Completed)   Thyroid Panel With TSH (Completed)   Lithium level (Completed)   Lipid panel (Completed)   VITAMIN D 25 Hydroxy (Vit-D Deficiency, Fractures) (Completed)   Vitamin B12 (Completed)    Other Visit Diagnoses    Estrogen deficiency       Relevant Orders   DG Bone Density   Need for pneumococcal vaccination       Relevant Orders   Pneumococcal conjugate vaccine 13-valent IM (Completed)   Yeast dermatitis       refilled cream originally prescribed by her GYN specialist.   Relevant Medications   nystatin-triamcinolone (MYCOLOG II) cream   Left ear pain       Some erythema of canal, following irrigation. Concern for subacute otitis externa.   Relevant Medications   ciprofloxacin-hydrocortisone (CIPRO HC OTIC) otic suspension   Other Relevant Orders   Ear wax removal (Completed)       Return in about 6 months (around 01/22/2017) for re-evaluation of thyroid, etc..   Fara Chute, PA-C [facility]

## 2016-07-24 ENCOUNTER — Encounter: Payer: Self-pay | Admitting: Radiology

## 2016-07-24 LAB — CBC WITH DIFFERENTIAL/PLATELET
Basophils Absolute: 0.1 10*3/uL (ref 0.0–0.2)
Basos: 1 %
EOS (ABSOLUTE): 0.4 10*3/uL (ref 0.0–0.4)
Eos: 3 %
Hematocrit: 40.7 % (ref 34.0–46.6)
Hemoglobin: 13.4 g/dL (ref 11.1–15.9)
Immature Grans (Abs): 0 10*3/uL (ref 0.0–0.1)
Immature Granulocytes: 0 %
Lymphocytes Absolute: 2.1 10*3/uL (ref 0.7–3.1)
Lymphs: 18 %
MCH: 30.6 pg (ref 26.6–33.0)
MCHC: 32.9 g/dL (ref 31.5–35.7)
MCV: 93 fL (ref 79–97)
Monocytes Absolute: 0.8 10*3/uL (ref 0.1–0.9)
Monocytes: 7 %
Neutrophils Absolute: 8.1 10*3/uL — ABNORMAL HIGH (ref 1.4–7.0)
Neutrophils: 71 %
Platelets: 435 10*3/uL — ABNORMAL HIGH (ref 150–379)
RBC: 4.38 x10E6/uL (ref 3.77–5.28)
RDW: 13.8 % (ref 12.3–15.4)
WBC: 11.4 10*3/uL — ABNORMAL HIGH (ref 3.4–10.8)

## 2016-07-24 LAB — LIPID PANEL
Chol/HDL Ratio: 3.5 ratio (ref 0.0–4.4)
Cholesterol, Total: 205 mg/dL — ABNORMAL HIGH (ref 100–199)
HDL: 58 mg/dL (ref >39)
LDL Calculated: 108 mg/dL — ABNORMAL HIGH (ref 0–99)
Triglycerides: 195 mg/dL — ABNORMAL HIGH (ref 0–149)
VLDL Cholesterol Cal: 39 mg/dL (ref 5–40)

## 2016-07-24 LAB — COMPREHENSIVE METABOLIC PANEL
ALT: 14 IU/L (ref 0–32)
AST: 17 IU/L (ref 0–40)
Albumin/Globulin Ratio: 1.8 (ref 1.2–2.2)
Albumin: 4.4 g/dL (ref 3.5–4.8)
Alkaline Phosphatase: 81 IU/L (ref 39–117)
BUN/Creatinine Ratio: 16 (ref 12–28)
BUN: 15 mg/dL (ref 8–27)
Bilirubin Total: 0.3 mg/dL (ref 0.0–1.2)
CO2: 20 mmol/L (ref 18–29)
Calcium: 9.6 mg/dL (ref 8.7–10.3)
Chloride: 102 mmol/L (ref 96–106)
Creatinine, Ser: 0.91 mg/dL (ref 0.57–1.00)
GFR calc Af Amer: 70 mL/min/{1.73_m2} (ref >59)
GFR calc non Af Amer: 61 mL/min/{1.73_m2} (ref >59)
Globulin, Total: 2.5 g/dL (ref 1.5–4.5)
Glucose: 120 mg/dL — ABNORMAL HIGH (ref 65–99)
Potassium: 4.5 mmol/L (ref 3.5–5.2)
Sodium: 141 mmol/L (ref 134–144)
Total Protein: 6.9 g/dL (ref 6.0–8.5)

## 2016-07-24 LAB — LITHIUM LEVEL: Lithium Lvl: 0.6 mmol/L (ref 0.6–1.2)

## 2016-07-24 LAB — THYROID PANEL WITH TSH
Free Thyroxine Index: 1.2 (ref 1.2–4.9)
T3 Uptake Ratio: 19 % — ABNORMAL LOW (ref 24–39)
T4, Total: 6.2 ug/dL (ref 4.5–12.0)
TSH: 7.11 u[IU]/mL — ABNORMAL HIGH (ref 0.450–4.500)

## 2016-07-24 LAB — VITAMIN D 25 HYDROXY (VIT D DEFICIENCY, FRACTURES): Vit D, 25-Hydroxy: 13.1 ng/mL — ABNORMAL LOW (ref 30.0–100.0)

## 2016-07-24 LAB — VITAMIN B12: Vitamin B-12: 454 pg/mL (ref 232–1245)

## 2016-07-24 NOTE — Assessment & Plan Note (Signed)
Reviewed CT scan from 03/2015 with her. Wonder if mild mesenteric panniculitis is causing this? Encouraged her to contact her GI specialist.

## 2016-07-24 NOTE — Assessment & Plan Note (Signed)
Labs updated for monitoring of this high risk medication.

## 2016-07-24 NOTE — Assessment & Plan Note (Signed)
Update labs. Adjust dose of levothyroxine as indicated.

## 2016-07-24 NOTE — Assessment & Plan Note (Signed)
Update lipids today. She is not fasting, so results will be interpreted with that in mind. If elevated, would repeat fasting before starting lipid lowering treatment.

## 2016-07-24 NOTE — Assessment & Plan Note (Signed)
Stable. Continue follow-up with Dr. Albertine Patricia.

## 2016-07-24 NOTE — Assessment & Plan Note (Signed)
Not fasting today, so if elevated, will need to repeat when fasting.

## 2016-07-26 ENCOUNTER — Telehealth: Payer: Self-pay

## 2016-07-26 NOTE — Telephone Encounter (Signed)
cipro ear drops not covered need sub or PA [phone]

## 2016-07-28 MED ORDER — OFLOXACIN 0.3 % OT SOLN: 10.0000 [drp] | Freq: Every day | OTIC | 0 refills | Status: AC

## 2016-07-28 NOTE — Telephone Encounter (Signed)
Meds ordered this encounter  Medications  . ofloxacin (FLOXIN) 0.3 % otic solution    Sig: Place 10 drops into the right ear daily.    Dispense:  5 mL    Refill:  0    Order Specific Question:   Supervising Provider    Answer:   Brigitte Pulse, EVA N [[id number]]

## 2016-10-01 DIAGNOSIS — H2513 Age-related nuclear cataract, bilateral: Secondary | ICD-10-CM | POA: Diagnosis not present

## 2016-10-01 DIAGNOSIS — H401112 Primary open-angle glaucoma, right eye, moderate stage: Secondary | ICD-10-CM | POA: Diagnosis not present

## 2016-10-01 DIAGNOSIS — H401123 Primary open-angle glaucoma, left eye, severe stage: Secondary | ICD-10-CM | POA: Diagnosis not present

## 2016-10-23 ENCOUNTER — Ambulatory Visit: Payer: Medicare Other | Admitting: Physician Assistant

## 2016-10-28 ENCOUNTER — Other Ambulatory Visit: Payer: Self-pay | Admitting: Physician Assistant

## 2016-10-28 DIAGNOSIS — E039 Hypothyroidism, unspecified: Secondary | ICD-10-CM

## 2016-11-05 DIAGNOSIS — F411 Generalized anxiety disorder: Secondary | ICD-10-CM | POA: Diagnosis not present

## 2016-11-05 DIAGNOSIS — F3163 Bipolar disorder, current episode mixed, severe, without psychotic features: Secondary | ICD-10-CM | POA: Diagnosis not present

## 2016-11-05 DIAGNOSIS — Z9114 Patient's other noncompliance with medication regimen: Secondary | ICD-10-CM | POA: Diagnosis not present

## 2016-11-07 ENCOUNTER — Telehealth: Payer: Self-pay | Admitting: Family Medicine

## 2016-11-07 NOTE — Telephone Encounter (Signed)
PATIENT HAS MADE AN APPOINTMENT TO SEE CHELLE ON Friday 11/28/16 AT 3:40pm TO HAVE HER THYROID CHECKED. SHE NEEDS TO HAVE FASTING BLOOD WORK. SHE WOULD LIKE TO KNOW IF SHE CAN COME IN EARLY THAT MORNING JUST TO HAVE HER BLOOD DRAWN AND THEN RETURN THAT AFTERNOON? BEST PHONE [phone] (HOME) [place]

## 2016-11-11 NOTE — Telephone Encounter (Signed)
See note below. Left message on pt's voicemail. OK to come in early for lab draw. Appointment is 11/28/16 at 3:40 pm.

## 2016-11-27 DIAGNOSIS — E559 Vitamin D deficiency, unspecified: Secondary | ICD-10-CM | POA: Insufficient documentation

## 2016-11-28 ENCOUNTER — Encounter: Payer: Self-pay | Admitting: Physician Assistant

## 2016-11-28 ENCOUNTER — Ambulatory Visit (INDEPENDENT_AMBULATORY_CARE_PROVIDER_SITE_OTHER): Payer: Medicare Other | Admitting: Physician Assistant

## 2016-11-28 VITALS — BP 135/75 | HR 86 | Temp 97.9°F | Resp 18 | Ht 62.75 in | Wt 182.2 lb

## 2016-11-28 DIAGNOSIS — Z23 Encounter for immunization: Secondary | ICD-10-CM

## 2016-11-28 DIAGNOSIS — F319 Bipolar disorder, unspecified: Secondary | ICD-10-CM | POA: Diagnosis not present

## 2016-11-28 DIAGNOSIS — E785 Hyperlipidemia, unspecified: Secondary | ICD-10-CM | POA: Diagnosis not present

## 2016-11-28 DIAGNOSIS — B372 Candidiasis of skin and nail: Secondary | ICD-10-CM | POA: Diagnosis not present

## 2016-11-28 DIAGNOSIS — E039 Hypothyroidism, unspecified: Secondary | ICD-10-CM

## 2016-11-28 DIAGNOSIS — Z79899 Other long term (current) drug therapy: Secondary | ICD-10-CM | POA: Diagnosis not present

## 2016-11-28 DIAGNOSIS — E559 Vitamin D deficiency, unspecified: Secondary | ICD-10-CM | POA: Diagnosis not present

## 2016-11-28 DIAGNOSIS — R739 Hyperglycemia, unspecified: Secondary | ICD-10-CM | POA: Diagnosis not present

## 2016-11-28 MED ORDER — NYSTATIN 100000 UNIT/GM EX CREA: 1.0000 "application " | TOPICAL_CREAM | Freq: Two times a day (BID) | CUTANEOUS | 99 refills | Status: DC

## 2016-11-28 MED ORDER — TRIAMCINOLONE ACETONIDE 0.1 % EX CREA: 1.0000 "application " | TOPICAL_CREAM | Freq: Two times a day (BID) | CUTANEOUS | 1 refills | Status: DC | PRN

## 2016-11-28 NOTE — Patient Instructions (Addendum)
We recommend that you schedule a mammogram for breast cancer screening. Typically, you do not need a referral to do this. Please contact a local imaging center to schedule your mammogram.  [facility] - [phone]  *ask for the Radiology Department [facility]) - [phone] or [phone]  [facility] - [phone] [facility] - [phone] [facility] - [phone]  *ask for the Radiology Department [facility] - [phone]  *ask for the Radiology Department [facility] - [phone]  *ask for the Mammography Department [facility] - [phone]     IF you received an x-ray today, you will receive an invoice from [facility]. Please contact [facility] at [phone] with questions or concerns regarding your invoice.   IF you received labwork today, you will receive an invoice from [facility]. Please contact [facility] at [phone] with questions or concerns regarding your invoice.   Our billing staff will not be able to assist you with questions regarding bills from these companies.  You will be contacted with the lab results as soon as they are available. The fastest way to get your results is to activate your My Chart account. Instructions are located on the last page of this paperwork. If you have not heard from us regarding the results in 2 weeks, please contact this office.      

## 2016-11-28 NOTE — Progress Notes (Signed)
Patient ID: Laura Drake, female    DOB: 1940/12/29, 76 y.o.   MRN: [id number]  PCP: Harrison Mons, PA-C  Chief Complaint  Patient presents with  . Thyroid Check    Pt states she needs copies of blood work sent to Dr. Albertine Patricia (sp?) and a personal copy.  . Medication Refill    Levothyroxine 25 MCG, pt would an increase dose.  . Female issue    Pt would like to discuss a rash inside and outside her vagina and pt states she has a odor.    Subjective:   Presents for evaluation of thyroid function.   Her bipolar disorder is managed by Dr. Pearson Grippe, and lab results from today's visit need to be copied to her.   Lab Results  Component Value Date   TSH 7.110 (H) 07/23/2016  Dose not changed, as T4 was normal. Previously took 50 mcg. Desires an increased to reduce the likelihood of depressed mood, fatigue. She took her last dose of levothyroxine today.  She has previously used Lotrisone for vulvar rash, prescribed by GYN. I refilled it at her last visit, but when she got to the pharmacy, it was $250, so she didn't get it. Her pharmacist recommended that she ask for the two products separately.   Review of Systems No chest pain, SOB, HA, dizziness, vision change, N/V, diarrhea, dysuria, urinary urgency or frequency, myalgias, new arthralgias or other rash.     Patient Active Problem List   Diagnosis Date Noted  . Vitamin D deficiency 11/27/2016  . Lithium use 07/23/2016  . Functional constipation 03/28/2016  . Family history of colon cancer 01/31/2016  . Primary open angle glaucoma of right eye, moderate stage 05/18/2015  . Primary open angle glaucoma of left eye, severe stage 05/18/2015  . Nuclear cataract of both eyes 05/18/2015  . Left inguinal hernia 10/04/2013  . Hyperglycemia 10/28/2010  . Abdominal pain 07/08/2010  . Hypothyroidism   . Bipolar affective disorder ([place])   . Hyperlipidemia   . DJD (degenerative joint disease)   . OSA (obstructive sleep  apnea) 09/24/2009     Prior to Admission medications   Medication Sig Start Date End Date Taking? Authorizing Provider  dorzolamide-timolol (COSOPT) 22.3-6.8 MG/ML ophthalmic solution Place 1 drop into both eyes 2 times daily. 10/01/16 10/01/17 Yes [provider]  FLUoxetine (PROZAC) 10 MG capsule Take 10 mg by mouth daily.   Yes [provider]  lamoTRIgine (LAMICTAL) 25 MG tablet Take 25 mg by mouth daily.    Yes [provider]  latanoprost (XALATAN) 0.005 % ophthalmic solution 2 drops at bedtime.   Yes [provider]  levothyroxine (SYNTHROID, LEVOTHROID) 25 MCG tablet TAKE 1 AND 1/2 TABLETS BY MOUTH DAILY 10/28/16  Yes Myli Pae, PA-C  lithium (LITHOBID) 300 MG CR tablet Take 300 mg by mouth daily.     Yes [provider]  polyethylene glycol powder (MIRALAX) powder Take 17 g by mouth as needed. Patient uses this medication for constipation.   Yes [provider]  Probiotic Product (PROBIOTIC DAILY PO) Take by mouth.   Yes [provider]  nystatin-triamcinolone (MYCOLOG II) cream Apply 1 application topically 2 (two) times daily. Apply to affected area BID for 5 days Patient not taking: Reported on 11/28/2016 07/23/16   Harrison Mons, PA-C     Allergies  Allergen Reactions  . Amitiza [Lubiprostone] Diarrhea, Nausea And Vomiting and Other (See Comments)    Bad pains  . Other Swelling  Shellfish  . Sulfa Antibiotics   . Tramadol Rash       Objective:  Physical Exam  Constitutional: She is oriented to person, place, and time. She appears well-developed and well-nourished. She is active and cooperative. No distress.  BP 135/75 (BP Location: Right Arm, Patient Position: Sitting, Cuff Size: Normal)   Pulse 86   Temp 97.9 F (36.6 C)   Resp 18   Ht 5' 2.75" (1.594 m)   Wt 182 lb 3.2 oz (82.6 kg)   SpO2 96%   BMI 32.53 kg/m   HENT:  Head: Normocephalic and atraumatic.  Right Ear: Hearing normal.  Left  Ear: Hearing normal.  Mouth/Throat: Uvula is midline, oropharynx is clear and moist and mucous membranes are normal.  Eyes: Conjunctivae are normal. No scleral icterus.  Neck: Normal range of motion and phonation normal. Neck supple. No thyroid mass and no thyromegaly present.  Cardiovascular: Normal rate, regular rhythm and normal heart sounds.   Pulses:      Radial pulses are 2+ on the right side, and 2+ on the left side.  Pulmonary/Chest: Effort normal and breath sounds normal.  Lymphadenopathy:       Head (right side): No tonsillar, no preauricular, no posterior auricular and no occipital adenopathy present.       Head (left side): No tonsillar, no preauricular, no posterior auricular and no occipital adenopathy present.    She has no cervical adenopathy.       Right: No supraclavicular adenopathy present.       Left: No supraclavicular adenopathy present.  Neurological: She is alert and oriented to person, place, and time. No sensory deficit.  Skin: Skin is warm, dry and intact. No rash noted. No cyanosis or erythema. Nails show no clubbing.  Psychiatric: She has a normal mood and affect. Her speech is normal and behavior is normal.       Assessment & Plan:   Problem List Items Addressed This Visit    Hypothyroidism - Primary    Await labs. Adjust regimen as indicated by results. If TSH is again elevated, increased dose would be appropriate. 50 mcg tablet prescribed, but she is instructed to take only 1/2 tablet until she hears from me.      Relevant Orders   Thyroid Panel With TSH (Completed)   Bipolar affective disorder ([place])    Continue follow-up per Dr. Albertine Patricia.      Hyperlipidemia    Await labs. Adjust regimen as indicated by results.       Relevant Orders   Comprehensive metabolic panel (Completed)   Lipid panel (Completed)   Hyperglycemia    Await labs. Adjust regimen as indicated by results.       Relevant Orders   Comprehensive metabolic panel (Completed)    Hemoglobin A1c (Completed)   Lithium use    Await labs. Will forward to Dr. Albertine Patricia.      Relevant Orders   CBC with Differential/Platelet (Completed)   Comprehensive metabolic panel (Completed)   Lipid panel (Completed)   Vitamin B12 (Completed)   Lithium level (Completed)   Thyroid Panel With TSH (Completed)   Vitamin D deficiency    Await labs. Adjust regimen as indicated by results.       Relevant Orders   VITAMIN D 25 Hydroxy (Vit-D Deficiency, Fractures) (Completed)    Other Visit Diagnoses    Yeast dermatitis       Relevant Medications   nystatin cream (MYCOSTATIN)   triamcinolone cream (KENALOG) 0.1 %  Need for influenza vaccination       Relevant Orders   Flu Vaccine QUAD 36+ mos IM (Completed)   Care order/instruction: (Completed)       Return in about 6 weeks (around 01/09/2017) for thyroid recheck.   Fara Chute, PA-C [facility]

## 2016-11-29 ENCOUNTER — Telehealth: Payer: Self-pay | Admitting: Physician Assistant

## 2016-11-29 LAB — CBC WITH DIFFERENTIAL/PLATELET
Basophils Absolute: 0 10*3/uL (ref 0.0–0.2)
Basos: 0 %
EOS (ABSOLUTE): 0.3 10*3/uL (ref 0.0–0.4)
Eos: 3 %
Hematocrit: 41.8 % (ref 34.0–46.6)
Hemoglobin: 13.4 g/dL (ref 11.1–15.9)
Immature Grans (Abs): 0 10*3/uL (ref 0.0–0.1)
Immature Granulocytes: 0 %
Lymphocytes Absolute: 1.5 10*3/uL (ref 0.7–3.1)
Lymphs: 16 %
MCH: 29.7 pg (ref 26.6–33.0)
MCHC: 32.1 g/dL (ref 31.5–35.7)
MCV: 93 fL (ref 79–97)
Monocytes Absolute: 0.8 10*3/uL (ref 0.1–0.9)
Monocytes: 8 %
Neutrophils Absolute: 7.1 10*3/uL — ABNORMAL HIGH (ref 1.4–7.0)
Neutrophils: 73 %
Platelets: 318 10*3/uL (ref 150–379)
RBC: 4.51 x10E6/uL (ref 3.77–5.28)
RDW: 14.6 % (ref 12.3–15.4)
WBC: 9.7 10*3/uL (ref 3.4–10.8)

## 2016-11-29 LAB — COMPREHENSIVE METABOLIC PANEL
ALT: 11 IU/L (ref 0–32)
AST: 12 IU/L (ref 0–40)
Albumin/Globulin Ratio: 1.8 (ref 1.2–2.2)
Albumin: 4.2 g/dL (ref 3.5–4.8)
Alkaline Phosphatase: 83 IU/L (ref 39–117)
BUN/Creatinine Ratio: 13 (ref 12–28)
BUN: 12 mg/dL (ref 8–27)
Bilirubin Total: 0.3 mg/dL (ref 0.0–1.2)
CO2: 20 mmol/L (ref 20–29)
Calcium: 9.2 mg/dL (ref 8.7–10.3)
Chloride: 106 mmol/L (ref 96–106)
Creatinine, Ser: 0.91 mg/dL (ref 0.57–1.00)
GFR calc Af Amer: 70 mL/min/{1.73_m2} (ref >59)
GFR calc non Af Amer: 61 mL/min/{1.73_m2} (ref >59)
Globulin, Total: 2.4 g/dL (ref 1.5–4.5)
Glucose: 97 mg/dL (ref 65–99)
Potassium: 4.8 mmol/L (ref 3.5–5.2)
Sodium: 141 mmol/L (ref 134–144)
Total Protein: 6.6 g/dL (ref 6.0–8.5)

## 2016-11-29 LAB — HEMOGLOBIN A1C
Est. average glucose Bld gHb Est-mCnc: 123 mg/dL
Hgb A1c MFr Bld: 5.9 % — ABNORMAL HIGH (ref 4.8–5.6)

## 2016-11-29 LAB — LIPID PANEL
Chol/HDL Ratio: 3.4 ratio (ref 0.0–4.4)
Cholesterol, Total: 213 mg/dL — ABNORMAL HIGH (ref 100–199)
HDL: 62 mg/dL (ref >39)
LDL Calculated: 125 mg/dL — ABNORMAL HIGH (ref 0–99)
Triglycerides: 131 mg/dL (ref 0–149)
VLDL Cholesterol Cal: 26 mg/dL (ref 5–40)

## 2016-11-29 LAB — THYROID PANEL WITH TSH
Free Thyroxine Index: 1.3 (ref 1.2–4.9)
T3 Uptake Ratio: 21 % — ABNORMAL LOW (ref 24–39)
T4, Total: 6.3 ug/dL (ref 4.5–12.0)
TSH: 4.18 u[IU]/mL (ref 0.450–4.500)

## 2016-11-29 LAB — VITAMIN D 25 HYDROXY (VIT D DEFICIENCY, FRACTURES): Vit D, 25-Hydroxy: 11.3 ng/mL — ABNORMAL LOW (ref 30.0–100.0)

## 2016-11-29 LAB — LITHIUM LEVEL: Lithium Lvl: 0.4 mmol/L — ABNORMAL LOW (ref 0.6–1.2)

## 2016-11-29 LAB — VITAMIN B12: Vitamin B-12: 310 pg/mL (ref 232–1245)

## 2016-11-29 NOTE — Assessment & Plan Note (Signed)
Await labs. Adjust regimen as indicated by results. If TSH is again elevated, increased dose would be appropriate. 50 mcg tablet prescribed, but she is instructed to take only 1/2 tablet until she hears from me.

## 2016-11-29 NOTE — Assessment & Plan Note (Signed)
Await labs. Adjust regimen as indicated by results.  

## 2016-11-29 NOTE — Telephone Encounter (Signed)
Patient called in stating she saw Chelle on 11/28/16 for getting her thyroid medicine refilled, Chelle did not call this into her pharmacy and she is completely out of this medication.   Can we get this called in for her ASAP. [facility] uses the [facility] on [address]. Can someone please give the patient a call and let her know when it is sent in [phone]

## 2016-11-29 NOTE — Assessment & Plan Note (Signed)
Await labs. Will forward to Dr. Albertine Patricia.

## 2016-11-29 NOTE — Assessment & Plan Note (Signed)
Continue follow-up per Dr. Albertine Patricia.

## 2016-12-01 ENCOUNTER — Other Ambulatory Visit: Payer: Self-pay | Admitting: Emergency Medicine

## 2016-12-01 DIAGNOSIS — E039 Hypothyroidism, unspecified: Secondary | ICD-10-CM

## 2016-12-01 MED ORDER — LEVOTHYROXINE SODIUM 25 MCG PO TABS: 37.5000 ug | ORAL_TABLET | Freq: Every day | ORAL | 3 refills | Status: DC

## 2016-12-01 NOTE — Telephone Encounter (Signed)
Left message on vm thyroid medication sent to pharmacy.

## 2016-12-03 MED ORDER — VITAMIN D (ERGOCALCIFEROL) 1.25 MG (50000 UNIT) PO CAPS: 50000.0000 [IU] | ORAL_CAPSULE | ORAL | 3 refills | Status: DC

## 2016-12-03 NOTE — Addendum Note (Signed)
Addended by: Fara Chute on: 12/03/2016 01:57 PM   Modules accepted: Orders

## 2016-12-25 DIAGNOSIS — Z9114 Patient's other noncompliance with medication regimen: Secondary | ICD-10-CM | POA: Diagnosis not present

## 2016-12-25 DIAGNOSIS — F411 Generalized anxiety disorder: Secondary | ICD-10-CM | POA: Diagnosis not present

## 2016-12-25 DIAGNOSIS — F3173 Bipolar disorder, in partial remission, most recent episode manic: Secondary | ICD-10-CM | POA: Diagnosis not present

## 2017-01-09 ENCOUNTER — Telehealth: Payer: Self-pay | Admitting: Physician Assistant

## 2017-01-09 ENCOUNTER — Ambulatory Visit (INDEPENDENT_AMBULATORY_CARE_PROVIDER_SITE_OTHER): Payer: Medicare Other | Admitting: Physician Assistant

## 2017-01-09 ENCOUNTER — Encounter: Payer: Self-pay | Admitting: Physician Assistant

## 2017-01-09 ENCOUNTER — Ambulatory Visit (INDEPENDENT_AMBULATORY_CARE_PROVIDER_SITE_OTHER): Payer: Medicare Other

## 2017-01-09 VITALS — BP 124/86 | HR 76 | Temp 98.3°F | Resp 16 | Ht 62.75 in | Wt 179.2 lb

## 2017-01-09 DIAGNOSIS — R29898 Other symptoms and signs involving the musculoskeletal system: Secondary | ICD-10-CM

## 2017-01-09 DIAGNOSIS — E559 Vitamin D deficiency, unspecified: Secondary | ICD-10-CM

## 2017-01-09 DIAGNOSIS — E039 Hypothyroidism, unspecified: Secondary | ICD-10-CM

## 2017-01-09 DIAGNOSIS — F319 Bipolar disorder, unspecified: Secondary | ICD-10-CM | POA: Diagnosis not present

## 2017-01-09 DIAGNOSIS — B372 Candidiasis of skin and nail: Secondary | ICD-10-CM | POA: Diagnosis not present

## 2017-01-09 DIAGNOSIS — R296 Repeated falls: Secondary | ICD-10-CM

## 2017-01-09 MED ORDER — LEVOTHYROXINE SODIUM 50 MCG PO TABS: 50.0000 ug | ORAL_TABLET | Freq: Every day | ORAL | 3 refills | Status: DC

## 2017-01-09 NOTE — Assessment & Plan Note (Signed)
Continue per her psychiatrist, Dr. Albertine Patricia.

## 2017-01-09 NOTE — Assessment & Plan Note (Signed)
Spoke with pharmacy. They confirmed having not received my prescription. Verbal order given.
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
## 2017-01-09 NOTE — Assessment & Plan Note (Signed)
Reordered levothyroxine to reflect how she is actually taking it and advised of the importance of honesty with healthcare providers, especially related to medications. Plan repeat TSH in 05/2017.

## 2017-01-09 NOTE — Telephone Encounter (Signed)
Spoke with pharmacist.  Rx sent 12/03/2016 was not received. Order given verbally. Patient advised.

## 2017-01-09 NOTE — Progress Notes (Signed)
Patient ID: Gurnoor Sloop, female    DOB: [birth year], 76 y.o.   MRN: [id number]  PCP: Harrison Mons, PA-C  Chief Complaint  Patient presents with  . Medication Refill    patient would like refill on thyroid medication  . Fall    patient states that she had a fall 16 days ago, has broken rib    Subjective:   Presents for evaluation of several issues.  Initially, she presented for a refill of levothyroxine, but then has several other issues to discuss.  1. At her visit 11/28/2016 her labs (due to Lithium use for treatment of Bipolar disorder) were updated and sent to Dr. Albertine Patricia. She was concerned that a sub-therapeutic levothyroxine dose may increase her depressive symptoms, she wanted to increase the dose from 37.5 mcg to 50 mcg. I wrote for the 50 mcg tablet, but advised her to take only 1.5 tablet until I could review the lab results,. The results were normal, and she was advised to continue the 37.5 mcg dose. Based on the recommended dose and refills provided, she should not be out of it yet. Today she relates that she has been taking 50 mcg since 06/2016, and did not share that with me at her last visit.  2. Recent falls. She relates 5 falls this year. 2 weeks ago she fell and feels like she broke a rib. She desires a Rollator walker to reduce falls. Denies tripping over things, visual disturbance, LE weakness. Relates that the rib pain is improving, no SOB.  3. Vitamin D wasn't at the pharmacy, though she was advised to start 50,000 IU weekly.  4. Vulvar rash and vaginal odor. At her last visit, she requested a refill of steroid and anti-fungal creams (in place of Lotrisone, due to cost) previously prescribed by GYN for vulvar yeast. Glucose was normal (87), but A1C was 5.9%, improved from 6.2% 5 months previously. The vulvar itching is a chronic problem for her, generally well controlled with the creams, but she stopped using it after her recent fall and the symptoms have  recurred.    Review of Systems As above.    Patient Active Problem List   Diagnosis Date Noted  . Vitamin D deficiency 11/27/2016  . Lithium use 07/23/2016  . Functional constipation 03/28/2016  . Family history of colon cancer 01/31/2016  . Primary open angle glaucoma of right eye, moderate stage 05/18/2015  . Primary open angle glaucoma of left eye, severe stage 05/18/2015  . Nuclear cataract of both eyes 05/18/2015  . Left inguinal hernia 10/04/2013  . Hyperglycemia 10/28/2010  . Abdominal pain 07/08/2010  . Hypothyroidism   . Bipolar affective disorder ([place])   . Hyperlipidemia   . DJD (degenerative joint disease)   . OSA (obstructive sleep apnea) 09/24/2009     Prior to Admission medications   Medication Sig Start Date End Date Taking? Authorizing Provider  dorzolamide-timolol (COSOPT) 22.3-6.8 MG/ML ophthalmic solution Place 1 drop into both eyes 2 times daily. 10/01/16 10/01/17 Yes [provider]  FLUoxetine (PROZAC) 10 MG capsule Take 10 mg by mouth daily.   Yes [provider]  lamoTRIgine (LAMICTAL) 25 MG tablet Take 25 mg by mouth daily.    Yes [provider]  latanoprost (XALATAN) 0.005 % ophthalmic solution 2 drops at bedtime.   Yes [provider]  levothyroxine (SYNTHROID, LEVOTHROID) 25 MCG tablet Take 1.5 tablets (37.5 mcg total) by mouth daily. 12/01/16  Yes Alenah Sarria, PA-C  lithium (LITHOBID) 300  MG CR tablet Take 600 mg by mouth daily.     Yes [provider]  nystatin cream (MYCOSTATIN) Apply 1 application topically 2 (two) times daily. 11/28/16  Yes Zhania Shaheen, PA-C  nystatin-triamcinolone (MYCOLOG II) cream Apply 1 application topically 2 (two) times daily. Apply to affected area BID for 5 days 07/23/16  Yes Yuki Brunsman, PA-C  polyethylene glycol powder (MIRALAX) powder Take 17 g by mouth as needed. Patient uses this medication for constipation.   Yes [provider]  Probiotic Product  (PROBIOTIC DAILY PO) Take by mouth.   Yes [provider]  triamcinolone cream (KENALOG) 0.1 % Apply 1 application topically 2 (two) times daily as needed. If use daily, take 1 week break every 2 weeks 11/28/16  Yes Shan Padgett, PA-C  Vitamin D, Ergocalciferol, (DRISDOL) 50000 units CAPS capsule Take 1 capsule (50,000 Units total) by mouth every 7 (seven) days. 12/03/16  Yes Harrison Mons, PA-C     Allergies  Allergen Reactions  . Amitiza [Lubiprostone] Diarrhea, Nausea And Vomiting and Other (See Comments)    Bad pains  . Other Swelling    Shellfish  . Sulfa Antibiotics   . Tramadol Rash       Objective:  Physical Exam  Constitutional: She is oriented to person, place, and time. She appears well-developed and well-nourished. She is active and cooperative. No distress.  BP 124/86 (BP Location: Left Arm, Patient Position: Sitting)   Pulse 76   Temp 98.3 F (36.8 C) (Oral)   Resp 16   Ht 5' 2.75" (1.594 m)   Wt 179 lb 3.2 oz (81.3 kg)   SpO2 97%   BMI 32.00 kg/m   HENT:  Head: Normocephalic and atraumatic.  Right Ear: Hearing normal.  Left Ear: Hearing normal.  Eyes: Conjunctivae are normal. No scleral icterus.  Neck: Normal range of motion. Neck supple. No thyromegaly present.  Cardiovascular: Normal rate, regular rhythm and normal heart sounds.   Pulses:      Radial pulses are 2+ on the right side, and 2+ on the left side.  Pulmonary/Chest: Effort normal and breath sounds normal.  Musculoskeletal:  LE weakness, symmetric.  Lymphadenopathy:       Head (right side): No tonsillar, no preauricular, no posterior auricular and no occipital adenopathy present.       Head (left side): No tonsillar, no preauricular, no posterior auricular and no occipital adenopathy present.    She has no cervical adenopathy.       Right: No supraclavicular adenopathy present.       Left: No supraclavicular adenopathy present.  Neurological: She is alert and oriented to person,  place, and time. No sensory deficit.  LE weakness is symmetric. DTRs are normal.  Skin: Skin is warm, dry and intact. No rash noted. No cyanosis or erythema. Nails show no clubbing.  Psychiatric: She has a normal mood and affect. Her speech is normal and behavior is normal.    Wt Readings from Last 3 Encounters:  01/09/17 179 lb 3.2 oz (81.3 kg)  11/28/16 182 lb 3.2 oz (82.6 kg)  07/23/16 173 lb (78.5 kg)       Assessment & Plan:   Problem List Items Addressed This Visit    Hypothyroidism - Primary    Reordered levothyroxine to reflect how she is actually taking it and advised of the importance of honesty with healthcare providers, especially related to medications. Plan repeat TSH in 05/2017.      Relevant Medications   levothyroxine (  SYNTHROID, LEVOTHROID) 50 MCG tablet   Bipolar affective disorder ([place])    Continue per her psychiatrist, Dr. Albertine Patricia.      Vitamin D deficiency    Spoke with pharmacy. They confirmed having not received my prescription. Verbal order given.       Other Visit Diagnoses    Yeast dermatitis       resume topical products. If symptoms persist, re-evaluate here or with GYN.   Multiple falls       Ask PT to assess balance and core/LE strength and advise regarding assistive device for ambulation.   Relevant Orders   Ambulatory referral to Physical Therapy   DG Lumbar Spine Complete (Completed)   Leg weakness, bilateral       L-spine films today, degenerative changes could suggest nerve impingement. PT evalaution to recommend therapy and assistive device for ambulation.   Relevant Orders   DG Lumbar Spine Complete (Completed)       No Follow-up on file.   Fara Chute, PA-C [facility] [place]

## 2017-01-09 NOTE — Patient Instructions (Signed)
     IF you received an x-ray today, you will receive an invoice from [facility]. Please contact [facility] at [phone] with questions or concerns regarding your invoice.   IF you received labwork today, you will receive an invoice from [facility]. Please contact [facility] at [phone] with questions or concerns regarding your invoice.   Our billing staff will not be able to assist you with questions regarding bills from these companies.  You will be contacted with the lab results as soon as they are available. The fastest way to get your results is to activate your My Chart account. Instructions are located on the last page of this paperwork. If you have not heard from us regarding the results in 2 weeks, please contact this office.     

## 2017-01-13 ENCOUNTER — Telehealth: Payer: Self-pay | Admitting: Physician Assistant

## 2017-01-13 NOTE — Telephone Encounter (Signed)
Pt would like a CB concerning her x-ray of her spine from 01/09/17. Please advise at [phone]

## 2017-01-14 NOTE — Telephone Encounter (Signed)
Sorry I was going to route this message to you just so your aware.

## 2017-01-14 NOTE — Telephone Encounter (Signed)
Spoke with patient about results and she is concerned about doing physical therapy because she doesn't drive and doesn't have anyone to take her but she states she will attempt  PT.

## 2017-03-12 DIAGNOSIS — F411 Generalized anxiety disorder: Secondary | ICD-10-CM | POA: Diagnosis not present

## 2017-03-12 DIAGNOSIS — F319 Bipolar disorder, unspecified: Secondary | ICD-10-CM | POA: Diagnosis not present

## 2017-03-12 DIAGNOSIS — Z9114 Patient's other noncompliance with medication regimen: Secondary | ICD-10-CM | POA: Diagnosis not present

## 2017-04-06 DIAGNOSIS — H401123 Primary open-angle glaucoma, left eye, severe stage: Secondary | ICD-10-CM | POA: Diagnosis not present

## 2017-04-06 DIAGNOSIS — H401112 Primary open-angle glaucoma, right eye, moderate stage: Secondary | ICD-10-CM | POA: Diagnosis not present

## 2017-06-15 ENCOUNTER — Encounter: Payer: Self-pay | Admitting: Physician Assistant

## 2017-06-15 ENCOUNTER — Ambulatory Visit (INDEPENDENT_AMBULATORY_CARE_PROVIDER_SITE_OTHER): Payer: Medicare Other

## 2017-06-15 ENCOUNTER — Other Ambulatory Visit: Payer: Self-pay

## 2017-06-15 ENCOUNTER — Ambulatory Visit (INDEPENDENT_AMBULATORY_CARE_PROVIDER_SITE_OTHER): Payer: Medicare Other | Admitting: Physician Assistant

## 2017-06-15 VITALS — BP 131/86 | HR 77 | Temp 98.6°F | Resp 16 | Ht 63.0 in | Wt 187.0 lb

## 2017-06-15 DIAGNOSIS — N760 Acute vaginitis: Secondary | ICD-10-CM

## 2017-06-15 DIAGNOSIS — S92411A Displaced fracture of proximal phalanx of right great toe, initial encounter for closed fracture: Secondary | ICD-10-CM | POA: Diagnosis not present

## 2017-06-15 DIAGNOSIS — M79671 Pain in right foot: Secondary | ICD-10-CM

## 2017-06-15 DIAGNOSIS — T148XXA Other injury of unspecified body region, initial encounter: Secondary | ICD-10-CM | POA: Diagnosis not present

## 2017-06-15 DIAGNOSIS — B9689 Other specified bacterial agents as the cause of diseases classified elsewhere: Secondary | ICD-10-CM

## 2017-06-15 DIAGNOSIS — N898 Other specified noninflammatory disorders of vagina: Secondary | ICD-10-CM

## 2017-06-15 LAB — POCT WET + KOH PREP
Trich by wet prep: ABSENT
Yeast by KOH: ABSENT
Yeast by wet prep: ABSENT

## 2017-06-15 MED ORDER — CLINDAMYCIN HCL 300 MG PO CAPS: 300.0000 mg | ORAL_CAPSULE | Freq: Two times a day (BID) | ORAL | 0 refills | Status: DC
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
## 2017-06-15 NOTE — Patient Instructions (Addendum)
You have a fracture of your foot involving the joint space - any fracture involving the joint space if concerning and needs further evaluation by an orthopedic surgeon. You will receive a phone call to schedule this appointment. Please make this appointment as soon as you can.   Wear your shoe while you are walking. Continue to elevate and ice your foot to control the swelling.    You are positive for bacterial vaginosis. Please come and see [place] when your symptoms start.  Take clindamycin 372m twice daily for 7 days.    Thank you for coming in today. I hope you feel we met your needs.  Feel free to call PCP if you have any questions or further requests.  Please consider signing up for MyChart if you do not already have it, as this is a great way to communicate with me.  Best,  Whitney McVey, PA-C  IF you received an x-ray today, you will [facility] an [facility]. Please contact [facility] at [phone] questions or concerns regarding your invoice.   IF you received labwork today, you will receive an invoice from [facility] Please contact [facility] at [phone] questions or concerns regarding your invoice.   Our billing staff will not be able to assist you with questions regarding bills from these companies.  You will be contacted with the lab results as soon as they are available. The fastest way to get your results is to activate your My Chart account. Instructions are located on the last page of this paperwork. If you have not heard from [place] the results in 2 weeks, please contact this office.

## 2017-06-22 ENCOUNTER — Other Ambulatory Visit: Payer: Self-pay | Admitting: Physician Assistant

## 2017-06-22 DIAGNOSIS — B9689 Other specified bacterial agents as the cause of diseases classified elsewhere: Secondary | ICD-10-CM

## 2017-06-22 DIAGNOSIS — N760 Acute vaginitis: Secondary | ICD-10-CM

## 2017-06-26 ENCOUNTER — Other Ambulatory Visit: Payer: Self-pay | Admitting: Physician Assistant

## 2017-06-26 DIAGNOSIS — B372 Candidiasis of skin and nail: Secondary | ICD-10-CM

## 2017-06-30 ENCOUNTER — Other Ambulatory Visit: Payer: Self-pay

## 2017-06-30 ENCOUNTER — Encounter: Payer: Self-pay | Admitting: Physician Assistant

## 2017-06-30 ENCOUNTER — Ambulatory Visit: Payer: Medicare Other | Admitting: Physician Assistant

## 2017-06-30 VITALS — BP 124/80 | HR 93 | Temp 97.7°F | Resp 16 | Ht 63.0 in

## 2017-06-30 DIAGNOSIS — E785 Hyperlipidemia, unspecified: Secondary | ICD-10-CM

## 2017-06-30 DIAGNOSIS — R739 Hyperglycemia, unspecified: Secondary | ICD-10-CM | POA: Diagnosis not present

## 2017-06-30 DIAGNOSIS — Z79899 Other long term (current) drug therapy: Secondary | ICD-10-CM

## 2017-06-30 DIAGNOSIS — B372 Candidiasis of skin and nail: Secondary | ICD-10-CM | POA: Diagnosis not present

## 2017-06-30 DIAGNOSIS — E559 Vitamin D deficiency, unspecified: Secondary | ICD-10-CM

## 2017-06-30 DIAGNOSIS — N898 Other specified noninflammatory disorders of vagina: Secondary | ICD-10-CM

## 2017-06-30 DIAGNOSIS — E039 Hypothyroidism, unspecified: Secondary | ICD-10-CM

## 2017-06-30 LAB — POCT WET + KOH PREP
Trich by wet prep: ABSENT
Yeast by KOH: ABSENT
Yeast by wet prep: ABSENT

## 2017-06-30 MED ORDER — TRIAMCINOLONE ACETONIDE 0.1 % EX CREA: 1.0000 "application " | TOPICAL_CREAM | Freq: Two times a day (BID) | CUTANEOUS | 1 refills | Status: DC | PRN

## 2017-06-30 MED ORDER — NYSTATIN 100000 UNIT/GM EX CREA: 1.0000 "application " | TOPICAL_CREAM | Freq: Two times a day (BID) | CUTANEOUS | 99 refills | Status: DC

## 2017-06-30 NOTE — Patient Instructions (Addendum)
Dr. Collier Salina [place]field-orthopedic surgeon recommended for your toe [address], [place], [place] [address]  Phone: [phone]  Continue wearing the shoe until you see the orthopedic specialist. You don't have to have anything done, but there is increased risk of chronic pain and arthritis in the joint.   IF you received an x-ray today, you will receive an invoice from [facility]. Please contact [facility] at [phone] with questions or concerns regarding your invoice.   IF you received labwork today, you will receive an invoice from [facility]. Please contact [facility] at [phone] with questions or concerns regarding your invoice.   Our billing staff will not be able to assist you with questions regarding bills from these companies.  You will be contacted with the lab results as soon as they are available. The fastest way to get your results is to activate your My Chart account. Instructions are located on the last page of this paperwork. If you have not heard from [place] regarding the results in 2 weeks, please contact this office.

## 2017-06-30 NOTE — Progress Notes (Signed)
Patient ID: Laura Drake, female    DOB: 1941-01-30, 77 y.o.   MRN: [id number]  PCP: Harrison Mons, PA-C  Chief Complaint  Patient presents with  . Vaginitis    itching, burning,started after she took antibiotic     Subjective:   Presents for evaluation of itching, burning in the perineum following recent antibiotic therapy.  Vaginal itching and odor. Previously, clindamycin has helped. She received that for similar symptoms when she was here for evaluation of comminuted fracture of the RIGHT great toe, 06/15/2017. Wearing a post-op shoe.    Review of Systems As above.    Patient Active Problem List   Diagnosis Date Noted  . Vitamin D deficiency 11/27/2016  . Lithium use 07/23/2016  . Functional constipation 03/28/2016  . Family history of colon cancer 01/31/2016  . Primary open angle glaucoma of right eye, moderate stage 05/18/2015  . Primary open angle glaucoma of left eye, severe stage 05/18/2015  . Nuclear cataract of both eyes 05/18/2015  . Left inguinal hernia 10/04/2013  . Hyperglycemia 10/28/2010  . Abdominal pain 07/08/2010  . Hypothyroidism   . Bipolar affective disorder ([place])   . Hyperlipidemia   . DJD (degenerative joint disease)   . OSA (obstructive sleep apnea) 09/24/2009     Prior to Admission medications   Medication Sig Start Date End Date Taking? Authorizing Provider  clindamycin (CLEOCIN) 300 MG capsule Take 1 capsule (300 mg total) by mouth 2 (two) times daily. 06/15/17  Yes McVey, Gelene Mink, PA-C  dorzolamide-timolol (COSOPT) 22.3-6.8 MG/ML ophthalmic solution Place 1 drop into both eyes 2 times daily. 10/01/16 10/01/17 Yes [provider]  FLUoxetine (PROZAC) 10 MG capsule Take 10 mg by mouth daily.   Yes [provider]  lamoTRIgine (LAMICTAL) 25 MG tablet Take 25 mg by mouth daily.    Yes [provider]  latanoprost (XALATAN) 0.005 % ophthalmic solution 2 drops at bedtime.   Yes [provider]  levothyroxine (SYNTHROID, LEVOTHROID) 50 MCG tablet Take 1 tablet (50 mcg total) by mouth daily. 01/09/17  Yes Eudell Julian, PA-C  lithium (LITHOBID) 300 MG CR tablet Take 300 mg by mouth daily.     Yes [provider]  nystatin cream (MYCOSTATIN) Apply 1 application topically 2 (two) times daily. 11/28/16  Yes Guled Gahan, PA-C  nystatin-triamcinolone (MYCOLOG II) cream Apply 1 application topically 2 (two) times daily. Apply to affected area BID for 5 days 07/23/16  Yes Brittania Sudbeck, PA-C  polyethylene glycol powder (MIRALAX) powder Take 17 g by mouth as needed. Patient uses this medication for constipation.   Yes [provider]  Probiotic Product (PROBIOTIC DAILY PO) Take by mouth.   Yes [provider]  triamcinolone cream (KENALOG) 0.1 % Apply 1 application topically 2 (two) times daily as needed. If use daily, take 1 week break every 2 weeks 11/28/16  Yes Jaelyne Deeg, PA-C  Vitamin D, Ergocalciferol, (DRISDOL) 50000 units CAPS capsule Take 1 capsule (50,000 Units total) by mouth every 7 (seven) days. 12/03/16  Yes Harrison Mons, PA-C     Allergies  Allergen Reactions  . Amitiza [Lubiprostone] Diarrhea, Nausea And Vomiting and Other (See Comments)    Bad pains  . Other Swelling    Shellfish  . Sulfa Antibiotics   . Tramadol Rash       Objective:  Physical Exam  Constitutional: She is oriented to person, place, and time. She appears well-developed and well-nourished. She is active and cooperative. No distress.  BP 124/80   Pulse 93   Temp 97.7 F (36.5 C)   Resp 16   Ht 5\' 3"  (1.6 m)   SpO2 95%   BMI 33.13 kg/m    Eyes: Conjunctivae are normal.  Pulmonary/Chest: Effort normal.  Abdominal: Hernia confirmed negative in the right inguinal area and confirmed negative in the left inguinal area.  Genitourinary: No labial fusion. There is no rash, tenderness, lesion or injury on the right labia. There is no rash, tenderness, lesion or  injury on the left labia. Right adnexum displays no mass, no tenderness and no fullness. Left adnexum displays no mass, no tenderness and no fullness. No erythema, tenderness or bleeding in the vagina. No foreign body in the vagina. No signs of injury around the vagina. No vaginal discharge found.  Genitourinary Comments: Cervix is surgically absent.  Lymphadenopathy:       Right: No inguinal adenopathy present.       Left: No inguinal adenopathy present.  Neurological: She is alert and oriented to person, place, and time.  Psychiatric: She has a normal mood and affect. Her speech is normal and behavior is normal.    Results for orders placed or performed in visit on 06/30/17  POCT Wet + KOH Prep  Result Value Ref Range   Yeast by KOH Absent Absent   Yeast by wet prep Absent Absent   WBC by wet prep Few Few   Clue Cells Wet Prep HPF POC None None   Trich by wet prep Absent Absent   Bacteria Wet Prep HPF POC Moderate (A) Few   Epithelial Cells By [employer] ([facility]) Few None, Few, Too numerous to count   RBC,UR,HPF,POC None None RBC/hpf          Assessment & Plan:   Problem List Items Addressed This Visit    Hypothyroidism   Relevant Orders   TSH (Completed)   T4, free (Completed)   Hyperlipidemia   Relevant Orders   Lipid panel (Completed)   Hyperglycemia   Relevant Orders   Hemoglobin A1c (Completed)   Lithium use   Relevant Orders   Lithium level (Completed)   CBC with Differential/Platelet (Completed)   Vitamin D deficiency   Relevant Orders   VITAMIN D 25 Hydroxy (Vit-D Deficiency, Fractures) (Completed)    Other Visit Diagnoses    Vaginal itching    -  Primary   Relevant Orders   POCT Wet + KOH Prep (Completed)   Yeast dermatitis       Relevant Medications   nystatin cream (MYCOSTATIN)   triamcinolone cream (KENALOG) 0.1 %       Return in about 6 months (around 12/31/2017) for re-evalaution of thyroid, cholesterol, etc with fasting labs.   Fara Chute, PA-C [facility]

## 2017-07-01 LAB — CBC WITH DIFFERENTIAL/PLATELET
Basophils Absolute: 0.1 10*3/uL (ref 0.0–0.2)
Basos: 1 %
EOS (ABSOLUTE): 0.2 10*3/uL (ref 0.0–0.4)
Eos: 2 %
Hematocrit: 40.3 % (ref 34.0–46.6)
Hemoglobin: 13.8 g/dL (ref 11.1–15.9)
Immature Grans (Abs): 0 10*3/uL (ref 0.0–0.1)
Immature Granulocytes: 0 %
Lymphocytes Absolute: 1.9 10*3/uL (ref 0.7–3.1)
Lymphs: 15 %
MCH: 30.6 pg (ref 26.6–33.0)
MCHC: 34.2 g/dL (ref 31.5–35.7)
MCV: 89 fL (ref 79–97)
Monocytes Absolute: 1.1 10*3/uL — ABNORMAL HIGH (ref 0.1–0.9)
Monocytes: 9 %
Neutrophils Absolute: 8.9 10*3/uL — ABNORMAL HIGH (ref 1.4–7.0)
Neutrophils: 73 %
Platelets: 387 10*3/uL — ABNORMAL HIGH (ref 150–379)
RBC: 4.51 x10E6/uL (ref 3.77–5.28)
RDW: 14.4 % (ref 12.3–15.4)
WBC: 12.2 10*3/uL — ABNORMAL HIGH (ref 3.4–10.8)

## 2017-07-01 LAB — LIPID PANEL
Chol/HDL Ratio: 4.1 ratio (ref 0.0–4.4)
Cholesterol, Total: 216 mg/dL — ABNORMAL HIGH (ref 100–199)
HDL: 53 mg/dL (ref >39)
LDL Calculated: 119 mg/dL — ABNORMAL HIGH (ref 0–99)
Triglycerides: 222 mg/dL — ABNORMAL HIGH (ref 0–149)
VLDL Cholesterol Cal: 44 mg/dL — ABNORMAL HIGH (ref 5–40)

## 2017-07-01 LAB — T4, FREE: Free T4: 0.98 ng/dL (ref 0.82–1.77)

## 2017-07-01 LAB — LITHIUM LEVEL: Lithium Lvl: 0.6 mmol/L (ref 0.6–1.2)

## 2017-07-01 LAB — TSH: TSH: 2.2 u[IU]/mL (ref 0.450–4.500)

## 2017-07-01 LAB — HEMOGLOBIN A1C
Est. average glucose Bld gHb Est-mCnc: 128 mg/dL
Hgb A1c MFr Bld: 6.1 % — ABNORMAL HIGH (ref 4.8–5.6)

## 2017-07-01 LAB — VITAMIN D 25 HYDROXY (VIT D DEFICIENCY, FRACTURES): Vit D, 25-Hydroxy: 14.7 ng/mL — ABNORMAL LOW (ref 30.0–100.0)

## 2017-07-03 NOTE — Telephone Encounter (Signed)
Please advise on labs.    Copied from [place] [phone]. Topic: Inquiry >> Jul 03, 2017  7:59 AM Arletha Grippe wrote: Reason for CRM: pt would like to have call when labs are back - [phone]

## 2017-07-04 NOTE — Telephone Encounter (Signed)
See result note for my lab comments (routed to lab pool).

## 2017-07-06 ENCOUNTER — Telehealth: Payer: Self-pay

## 2017-07-06 DIAGNOSIS — E559 Vitamin D deficiency, unspecified: Secondary | ICD-10-CM

## 2017-07-06 MED ORDER — VITAMIN D (ERGOCALCIFEROL) 1.25 MG (50000 UNIT) PO CAPS: 50000.0000 [IU] | ORAL_CAPSULE | ORAL | 1 refills | Status: DC

## 2017-07-06 NOTE — Telephone Encounter (Signed)
Copied from [place] [phone]. Topic: Inquiry >> Jul 03, 2017  7:59 AM Arletha Grippe wrote: Reason for CRM: pt would like to have call when labs are back - [phone]   >> Jul 06, 2017 12:34 PM Vernona Rieger wrote: Patient wants to know if these results were sent to her physiatrist. Please advise.   >> Jul 06, 2017 12:41 PM Ahmed Prima L wrote: Pt wants a call back regarding these labs

## 2017-07-06 NOTE — Telephone Encounter (Signed)
Phone call to patient. Relayed message regarding labs to patient. She verbalizes understanding.   She does need a refill of 50,000 unit vitamin D sent to her pharmacy.   She states that she is still having persisting problems with vaginal odor and burning, especially noticed after going to bed. The cream prescribed for her at last visit is not working. States this improved after a week of clindamycin, but issue has returned. Patient states she is washing her genital area twice daily with dove soap, puts on a pantyliner when she puts on cream that has been prescribed.   Vitamin D sent to pharmacy.  Provider, please regarding vaginal symptoms.

## 2017-07-07 NOTE — Telephone Encounter (Signed)
Spoke with patient gave instructions per note. Patient voiced understanding

## 2017-07-07 NOTE — Telephone Encounter (Signed)
Clindamycin is not appropriate at this time, as there is no infection.  Reduce soap use in the area, which can be drying/irritating. Recommend washing only once daily. Consider OTC Witch Hazel once daily. OK to stop the yeast cream. Use OTC baby diaper ointment to soothe the area (Balmex, Desitin, etc).

## 2017-07-13 ENCOUNTER — Encounter: Payer: Self-pay | Admitting: Physician Assistant

## 2017-07-23 DIAGNOSIS — F3173 Bipolar disorder, in partial remission, most recent episode manic: Secondary | ICD-10-CM | POA: Diagnosis not present

## 2017-07-23 DIAGNOSIS — Z9114 Patient's other noncompliance with medication regimen: Secondary | ICD-10-CM | POA: Diagnosis not present

## 2017-07-23 DIAGNOSIS — F411 Generalized anxiety disorder: Secondary | ICD-10-CM | POA: Diagnosis not present

## 2017-07-30 DIAGNOSIS — H401112 Primary open-angle glaucoma, right eye, moderate stage: Secondary | ICD-10-CM | POA: Diagnosis not present

## 2017-07-30 DIAGNOSIS — H401123 Primary open-angle glaucoma, left eye, severe stage: Secondary | ICD-10-CM | POA: Diagnosis not present

## 2017-10-22 DIAGNOSIS — Z9114 Patient's other noncompliance with medication regimen: Secondary | ICD-10-CM | POA: Diagnosis not present

## 2017-10-22 DIAGNOSIS — F411 Generalized anxiety disorder: Secondary | ICD-10-CM | POA: Diagnosis not present

## 2017-10-22 DIAGNOSIS — F3175 Bipolar disorder, in partial remission, most recent episode depressed: Secondary | ICD-10-CM | POA: Diagnosis not present

## 2018-01-15 DIAGNOSIS — E559 Vitamin D deficiency, unspecified: Secondary | ICD-10-CM | POA: Diagnosis not present

## 2018-01-15 DIAGNOSIS — E039 Hypothyroidism, unspecified: Secondary | ICD-10-CM | POA: Diagnosis not present

## 2018-01-15 DIAGNOSIS — E785 Hyperlipidemia, unspecified: Secondary | ICD-10-CM | POA: Diagnosis not present

## 2018-01-15 DIAGNOSIS — Z23 Encounter for immunization: Secondary | ICD-10-CM | POA: Diagnosis not present

## 2018-01-15 DIAGNOSIS — E2839 Other primary ovarian failure: Secondary | ICD-10-CM | POA: Diagnosis not present

## 2018-01-15 DIAGNOSIS — R739 Hyperglycemia, unspecified: Secondary | ICD-10-CM | POA: Diagnosis not present

## 2018-01-15 DIAGNOSIS — F319 Bipolar disorder, unspecified: Secondary | ICD-10-CM | POA: Diagnosis not present

## 2018-01-28 DIAGNOSIS — H401123 Primary open-angle glaucoma, left eye, severe stage: Secondary | ICD-10-CM | POA: Diagnosis not present

## 2018-01-28 DIAGNOSIS — H401112 Primary open-angle glaucoma, right eye, moderate stage: Secondary | ICD-10-CM | POA: Diagnosis not present

## 2018-03-18 DIAGNOSIS — R1032 Left lower quadrant pain: Secondary | ICD-10-CM | POA: Diagnosis not present

## 2018-03-18 DIAGNOSIS — R7303 Prediabetes: Secondary | ICD-10-CM | POA: Diagnosis not present

## 2018-03-18 DIAGNOSIS — R300 Dysuria: Secondary | ICD-10-CM | POA: Diagnosis not present

## 2018-04-15 DIAGNOSIS — E039 Hypothyroidism, unspecified: Secondary | ICD-10-CM | POA: Diagnosis not present

## 2018-04-15 DIAGNOSIS — R300 Dysuria: Secondary | ICD-10-CM | POA: Diagnosis not present

## 2018-04-15 DIAGNOSIS — F319 Bipolar disorder, unspecified: Secondary | ICD-10-CM | POA: Diagnosis not present

## 2018-04-15 DIAGNOSIS — R1032 Left lower quadrant pain: Secondary | ICD-10-CM | POA: Diagnosis not present

## 2018-04-15 DIAGNOSIS — N761 Subacute and chronic vaginitis: Secondary | ICD-10-CM | POA: Diagnosis not present

## 2018-04-29 DIAGNOSIS — F3163 Bipolar disorder, current episode mixed, severe, without psychotic features: Secondary | ICD-10-CM | POA: Diagnosis not present

## 2018-04-29 DIAGNOSIS — Z9114 Patient's other noncompliance with medication regimen: Secondary | ICD-10-CM | POA: Diagnosis not present

## 2018-04-29 DIAGNOSIS — F411 Generalized anxiety disorder: Secondary | ICD-10-CM | POA: Diagnosis not present

## 2018-06-18 DIAGNOSIS — F3162 Bipolar disorder, current episode mixed, moderate: Secondary | ICD-10-CM | POA: Diagnosis not present

## 2018-06-18 DIAGNOSIS — R739 Hyperglycemia, unspecified: Secondary | ICD-10-CM | POA: Diagnosis not present

## 2018-06-18 DIAGNOSIS — K5904 Chronic idiopathic constipation: Secondary | ICD-10-CM | POA: Diagnosis not present

## 2018-06-18 DIAGNOSIS — E039 Hypothyroidism, unspecified: Secondary | ICD-10-CM | POA: Diagnosis not present

## 2018-06-18 DIAGNOSIS — N761 Subacute and chronic vaginitis: Secondary | ICD-10-CM | POA: Diagnosis not present

## 2018-06-18 DIAGNOSIS — Z79899 Other long term (current) drug therapy: Secondary | ICD-10-CM | POA: Diagnosis not present

## 2018-06-18 DIAGNOSIS — E559 Vitamin D deficiency, unspecified: Secondary | ICD-10-CM | POA: Diagnosis not present

## 2018-06-18 DIAGNOSIS — E785 Hyperlipidemia, unspecified: Secondary | ICD-10-CM | POA: Diagnosis not present

## 2018-09-29 DIAGNOSIS — N761 Subacute and chronic vaginitis: Secondary | ICD-10-CM | POA: Diagnosis not present

## 2018-09-29 DIAGNOSIS — H6123 Impacted cerumen, bilateral: Secondary | ICD-10-CM | POA: Diagnosis not present

## 2018-10-27 DIAGNOSIS — H401123 Primary open-angle glaucoma, left eye, severe stage: Secondary | ICD-10-CM | POA: Diagnosis not present

## 2018-10-27 DIAGNOSIS — H401112 Primary open-angle glaucoma, right eye, moderate stage: Secondary | ICD-10-CM | POA: Diagnosis not present

## 2019-01-03 DIAGNOSIS — B354 Tinea corporis: Secondary | ICD-10-CM | POA: Diagnosis not present

## 2019-01-03 DIAGNOSIS — N760 Acute vaginitis: Secondary | ICD-10-CM | POA: Diagnosis not present

## 2019-01-03 DIAGNOSIS — A630 Anogenital (venereal) warts: Secondary | ICD-10-CM | POA: Diagnosis not present

## 2019-01-03 DIAGNOSIS — Z113 Encounter for screening for infections with a predominantly sexual mode of transmission: Secondary | ICD-10-CM | POA: Diagnosis not present

## 2019-01-24 DIAGNOSIS — A630 Anogenital (venereal) warts: Secondary | ICD-10-CM | POA: Diagnosis not present

## 2019-01-24 DIAGNOSIS — N760 Acute vaginitis: Secondary | ICD-10-CM | POA: Diagnosis not present

## 2019-01-24 DIAGNOSIS — B372 Candidiasis of skin and nail: Secondary | ICD-10-CM | POA: Diagnosis not present

## 2019-02-21 DIAGNOSIS — A630 Anogenital (venereal) warts: Secondary | ICD-10-CM | POA: Diagnosis not present

## 2019-02-21 DIAGNOSIS — N760 Acute vaginitis: Secondary | ICD-10-CM | POA: Diagnosis not present

## 2019-02-21 DIAGNOSIS — B372 Candidiasis of skin and nail: Secondary | ICD-10-CM | POA: Diagnosis not present

## 2019-03-10 DIAGNOSIS — Z23 Encounter for immunization: Secondary | ICD-10-CM | POA: Diagnosis not present

## 2019-03-10 DIAGNOSIS — E785 Hyperlipidemia, unspecified: Secondary | ICD-10-CM | POA: Diagnosis not present

## 2019-03-10 DIAGNOSIS — E039 Hypothyroidism, unspecified: Secondary | ICD-10-CM | POA: Diagnosis not present

## 2019-03-10 DIAGNOSIS — Z0000 Encounter for general adult medical examination without abnormal findings: Secondary | ICD-10-CM | POA: Diagnosis not present

## 2019-03-10 DIAGNOSIS — Z79899 Other long term (current) drug therapy: Secondary | ICD-10-CM | POA: Diagnosis not present

## 2019-03-10 DIAGNOSIS — E559 Vitamin D deficiency, unspecified: Secondary | ICD-10-CM | POA: Diagnosis not present

## 2019-03-10 DIAGNOSIS — F3162 Bipolar disorder, current episode mixed, moderate: Secondary | ICD-10-CM | POA: Diagnosis not present

## 2019-04-20 ENCOUNTER — Telehealth: Payer: Self-pay | Admitting: *Deleted

## 2019-04-20 NOTE — Telephone Encounter (Signed)
Schedule AWV.  

## 2019-04-25 DIAGNOSIS — N760 Acute vaginitis: Secondary | ICD-10-CM | POA: Diagnosis not present

## 2019-06-09 DIAGNOSIS — F3162 Bipolar disorder, current episode mixed, moderate: Secondary | ICD-10-CM | POA: Diagnosis not present

## 2019-06-09 DIAGNOSIS — E785 Hyperlipidemia, unspecified: Secondary | ICD-10-CM | POA: Diagnosis not present

## 2019-06-09 DIAGNOSIS — R739 Hyperglycemia, unspecified: Secondary | ICD-10-CM | POA: Diagnosis not present

## 2019-06-09 DIAGNOSIS — R5383 Other fatigue: Secondary | ICD-10-CM | POA: Diagnosis not present

## 2019-06-09 DIAGNOSIS — Z79899 Other long term (current) drug therapy: Secondary | ICD-10-CM | POA: Diagnosis not present

## 2019-07-07 DIAGNOSIS — F3162 Bipolar disorder, current episode mixed, moderate: Secondary | ICD-10-CM | POA: Diagnosis not present

## 2019-07-07 DIAGNOSIS — Z79899 Other long term (current) drug therapy: Secondary | ICD-10-CM | POA: Diagnosis not present

## 2019-07-07 DIAGNOSIS — R739 Hyperglycemia, unspecified: Secondary | ICD-10-CM | POA: Diagnosis not present

## 2019-07-28 DIAGNOSIS — R739 Hyperglycemia, unspecified: Secondary | ICD-10-CM | POA: Diagnosis not present

## 2019-07-28 DIAGNOSIS — Z79899 Other long term (current) drug therapy: Secondary | ICD-10-CM | POA: Diagnosis not present

## 2019-07-28 DIAGNOSIS — R5383 Other fatigue: Secondary | ICD-10-CM | POA: Diagnosis not present

## 2019-08-05 DIAGNOSIS — N761 Subacute and chronic vaginitis: Secondary | ICD-10-CM | POA: Diagnosis not present

## 2019-08-05 DIAGNOSIS — F319 Bipolar disorder, unspecified: Secondary | ICD-10-CM | POA: Diagnosis not present

## 2019-08-05 DIAGNOSIS — E039 Hypothyroidism, unspecified: Secondary | ICD-10-CM | POA: Diagnosis not present

## 2019-08-05 DIAGNOSIS — F3162 Bipolar disorder, current episode mixed, moderate: Secondary | ICD-10-CM | POA: Diagnosis not present

## 2019-08-17 DIAGNOSIS — N952 Postmenopausal atrophic vaginitis: Secondary | ICD-10-CM | POA: Diagnosis not present

## 2019-08-17 DIAGNOSIS — N761 Subacute and chronic vaginitis: Secondary | ICD-10-CM | POA: Diagnosis not present

## 2019-09-22 DIAGNOSIS — E785 Hyperlipidemia, unspecified: Secondary | ICD-10-CM | POA: Diagnosis not present

## 2019-09-22 DIAGNOSIS — E039 Hypothyroidism, unspecified: Secondary | ICD-10-CM | POA: Diagnosis not present

## 2019-09-22 DIAGNOSIS — E559 Vitamin D deficiency, unspecified: Secondary | ICD-10-CM | POA: Diagnosis not present

## 2019-09-22 DIAGNOSIS — R739 Hyperglycemia, unspecified: Secondary | ICD-10-CM | POA: Diagnosis not present

## 2019-09-22 DIAGNOSIS — Z79899 Other long term (current) drug therapy: Secondary | ICD-10-CM | POA: Diagnosis not present

## 2019-09-22 DIAGNOSIS — F3162 Bipolar disorder, current episode mixed, moderate: Secondary | ICD-10-CM | POA: Diagnosis not present

## 2019-10-27 DIAGNOSIS — H401112 Primary open-angle glaucoma, right eye, moderate stage: Secondary | ICD-10-CM | POA: Diagnosis not present

## 2019-10-27 DIAGNOSIS — H401123 Primary open-angle glaucoma, left eye, severe stage: Secondary | ICD-10-CM | POA: Diagnosis not present

## 2019-11-03 DIAGNOSIS — F3132 Bipolar disorder, current episode depressed, moderate: Secondary | ICD-10-CM | POA: Diagnosis not present

## 2019-12-30 DIAGNOSIS — Z79899 Other long term (current) drug therapy: Secondary | ICD-10-CM | POA: Diagnosis not present

## 2019-12-30 DIAGNOSIS — R739 Hyperglycemia, unspecified: Secondary | ICD-10-CM | POA: Diagnosis not present

## 2019-12-30 DIAGNOSIS — E039 Hypothyroidism, unspecified: Secondary | ICD-10-CM | POA: Diagnosis not present

## 2019-12-30 DIAGNOSIS — N898 Other specified noninflammatory disorders of vagina: Secondary | ICD-10-CM | POA: Diagnosis not present

## 2019-12-30 DIAGNOSIS — E559 Vitamin D deficiency, unspecified: Secondary | ICD-10-CM | POA: Diagnosis not present

## 2019-12-30 DIAGNOSIS — F3132 Bipolar disorder, current episode depressed, moderate: Secondary | ICD-10-CM | POA: Diagnosis not present

## 2019-12-30 DIAGNOSIS — E785 Hyperlipidemia, unspecified: Secondary | ICD-10-CM | POA: Diagnosis not present

## 2020-02-19 DIAGNOSIS — R918 Other nonspecific abnormal finding of lung field: Secondary | ICD-10-CM | POA: Diagnosis not present

## 2020-02-19 DIAGNOSIS — R7989 Other specified abnormal findings of blood chemistry: Secondary | ICD-10-CM | POA: Diagnosis not present

## 2020-02-19 DIAGNOSIS — F419 Anxiety disorder, unspecified: Secondary | ICD-10-CM | POA: Diagnosis not present

## 2020-02-19 DIAGNOSIS — E039 Hypothyroidism, unspecified: Secondary | ICD-10-CM | POA: Diagnosis not present

## 2020-02-19 DIAGNOSIS — E871 Hypo-osmolality and hyponatremia: Secondary | ICD-10-CM | POA: Diagnosis not present

## 2020-02-19 DIAGNOSIS — Z9151 Personal history of suicidal behavior: Secondary | ICD-10-CM | POA: Diagnosis not present

## 2020-02-19 DIAGNOSIS — J9601 Acute respiratory failure with hypoxia: Secondary | ICD-10-CM | POA: Diagnosis not present

## 2020-02-19 DIAGNOSIS — J90 Pleural effusion, not elsewhere classified: Secondary | ICD-10-CM | POA: Diagnosis not present

## 2020-02-19 DIAGNOSIS — M1990 Unspecified osteoarthritis, unspecified site: Secondary | ICD-10-CM | POA: Diagnosis not present

## 2020-02-19 DIAGNOSIS — E785 Hyperlipidemia, unspecified: Secondary | ICD-10-CM | POA: Diagnosis not present

## 2020-02-19 DIAGNOSIS — Z885 Allergy status to narcotic agent status: Secondary | ICD-10-CM | POA: Diagnosis not present

## 2020-02-19 DIAGNOSIS — Z881 Allergy status to other antibiotic agents status: Secondary | ICD-10-CM | POA: Diagnosis not present

## 2020-02-19 DIAGNOSIS — Z882 Allergy status to sulfonamides status: Secondary | ICD-10-CM | POA: Diagnosis not present

## 2020-02-19 DIAGNOSIS — Z888 Allergy status to other drugs, medicaments and biological substances status: Secondary | ICD-10-CM | POA: Diagnosis not present

## 2020-02-19 DIAGNOSIS — Z79899 Other long term (current) drug therapy: Secondary | ICD-10-CM | POA: Diagnosis not present

## 2020-02-19 DIAGNOSIS — J189 Pneumonia, unspecified organism: Secondary | ICD-10-CM | POA: Diagnosis not present

## 2020-02-19 DIAGNOSIS — F319 Bipolar disorder, unspecified: Secondary | ICD-10-CM | POA: Diagnosis not present

## 2020-02-19 DIAGNOSIS — G4733 Obstructive sleep apnea (adult) (pediatric): Secondary | ICD-10-CM | POA: Diagnosis not present

## 2020-02-19 DIAGNOSIS — R9431 Abnormal electrocardiogram [ECG] [EKG]: Secondary | ICD-10-CM | POA: Diagnosis not present

## 2020-02-19 DIAGNOSIS — U071 COVID-19: Secondary | ICD-10-CM | POA: Diagnosis not present

## 2020-02-19 DIAGNOSIS — J1282 Pneumonia due to coronavirus disease 2019: Secondary | ICD-10-CM | POA: Diagnosis not present

## 2020-02-20 DIAGNOSIS — R9431 Abnormal electrocardiogram [ECG] [EKG]: Secondary | ICD-10-CM | POA: Diagnosis not present

## 2020-02-20 DIAGNOSIS — R918 Other nonspecific abnormal finding of lung field: Secondary | ICD-10-CM | POA: Diagnosis not present

## 2020-02-20 DIAGNOSIS — R7989 Other specified abnormal findings of blood chemistry: Secondary | ICD-10-CM | POA: Diagnosis not present

## 2020-02-20 DIAGNOSIS — U071 COVID-19: Secondary | ICD-10-CM | POA: Diagnosis not present

## 2020-02-20 DIAGNOSIS — E871 Hypo-osmolality and hyponatremia: Secondary | ICD-10-CM | POA: Diagnosis not present

## 2020-02-20 DIAGNOSIS — J9601 Acute respiratory failure with hypoxia: Secondary | ICD-10-CM | POA: Diagnosis not present

## 2020-02-23 DIAGNOSIS — J9601 Acute respiratory failure with hypoxia: Secondary | ICD-10-CM | POA: Diagnosis not present

## 2020-02-23 DIAGNOSIS — U071 COVID-19: Secondary | ICD-10-CM | POA: Diagnosis not present

## 2020-02-23 DIAGNOSIS — J1282 Pneumonia due to coronavirus disease 2019: Secondary | ICD-10-CM | POA: Diagnosis not present

## 2020-02-27 DIAGNOSIS — J9601 Acute respiratory failure with hypoxia: Secondary | ICD-10-CM | POA: Diagnosis not present

## 2020-02-27 DIAGNOSIS — U071 COVID-19: Secondary | ICD-10-CM | POA: Diagnosis not present

## 2020-02-27 DIAGNOSIS — R110 Nausea: Secondary | ICD-10-CM | POA: Diagnosis not present

## 2020-02-27 DIAGNOSIS — J1282 Pneumonia due to coronavirus disease 2019: Secondary | ICD-10-CM | POA: Diagnosis not present

## 2020-03-05 DIAGNOSIS — U071 COVID-19: Secondary | ICD-10-CM | POA: Diagnosis not present

## 2020-03-05 DIAGNOSIS — J1282 Pneumonia due to coronavirus disease 2019: Secondary | ICD-10-CM | POA: Diagnosis not present

## 2020-03-05 DIAGNOSIS — R6889 Other general symptoms and signs: Secondary | ICD-10-CM | POA: Diagnosis not present

## 2020-03-05 DIAGNOSIS — J9601 Acute respiratory failure with hypoxia: Secondary | ICD-10-CM | POA: Diagnosis not present

## 2020-03-08 ENCOUNTER — Emergency Department (HOSPITAL_COMMUNITY): Payer: Medicare Other

## 2020-03-08 ENCOUNTER — Inpatient Hospital Stay (HOSPITAL_COMMUNITY)
Admission: EM | Admit: 2020-03-08 | Discharge: 2020-03-14 | DRG: 177 | Disposition: A | Discharge status: Home Health | Payer: Medicare Other | Attending: Family Medicine | Admitting: Family Medicine

## 2020-03-08 ENCOUNTER — Encounter (HOSPITAL_COMMUNITY): Payer: Self-pay

## 2020-03-08 DIAGNOSIS — Z818 Family history of other mental and behavioral disorders: Secondary | ICD-10-CM

## 2020-03-08 DIAGNOSIS — E559 Vitamin D deficiency, unspecified: Secondary | ICD-10-CM | POA: Diagnosis not present

## 2020-03-08 DIAGNOSIS — E039 Hypothyroidism, unspecified: Secondary | ICD-10-CM | POA: Diagnosis present

## 2020-03-08 DIAGNOSIS — R0902 Hypoxemia: Secondary | ICD-10-CM | POA: Diagnosis not present

## 2020-03-08 DIAGNOSIS — F319 Bipolar disorder, unspecified: Secondary | ICD-10-CM | POA: Diagnosis not present

## 2020-03-08 DIAGNOSIS — J189 Pneumonia, unspecified organism: Secondary | ICD-10-CM | POA: Diagnosis not present

## 2020-03-08 DIAGNOSIS — G4733 Obstructive sleep apnea (adult) (pediatric): Secondary | ICD-10-CM | POA: Diagnosis not present

## 2020-03-08 DIAGNOSIS — Z91013 Allergy to seafood: Secondary | ICD-10-CM

## 2020-03-08 DIAGNOSIS — Z9981 Dependence on supplemental oxygen: Secondary | ICD-10-CM

## 2020-03-08 DIAGNOSIS — Z9151 Personal history of suicidal behavior: Secondary | ICD-10-CM

## 2020-03-08 DIAGNOSIS — U071 COVID-19: Secondary | ICD-10-CM | POA: Diagnosis not present

## 2020-03-08 DIAGNOSIS — Z882 Allergy status to sulfonamides status: Secondary | ICD-10-CM | POA: Diagnosis not present

## 2020-03-08 DIAGNOSIS — J80 Acute respiratory distress syndrome: Secondary | ICD-10-CM | POA: Diagnosis not present

## 2020-03-08 DIAGNOSIS — E861 Hypovolemia: Secondary | ICD-10-CM | POA: Diagnosis not present

## 2020-03-08 DIAGNOSIS — Z79899 Other long term (current) drug therapy: Secondary | ICD-10-CM

## 2020-03-08 DIAGNOSIS — Z66 Do not resuscitate: Secondary | ICD-10-CM | POA: Diagnosis present

## 2020-03-08 DIAGNOSIS — R918 Other nonspecific abnormal finding of lung field: Secondary | ICD-10-CM | POA: Diagnosis not present

## 2020-03-08 DIAGNOSIS — Z885 Allergy status to narcotic agent status: Secondary | ICD-10-CM

## 2020-03-08 DIAGNOSIS — E785 Hyperlipidemia, unspecified: Secondary | ICD-10-CM | POA: Diagnosis not present

## 2020-03-08 DIAGNOSIS — T380X5A Adverse effect of glucocorticoids and synthetic analogues, initial encounter: Secondary | ICD-10-CM | POA: Diagnosis present

## 2020-03-08 DIAGNOSIS — J159 Unspecified bacterial pneumonia: Secondary | ICD-10-CM | POA: Diagnosis not present

## 2020-03-08 DIAGNOSIS — Z833 Family history of diabetes mellitus: Secondary | ICD-10-CM

## 2020-03-08 DIAGNOSIS — R0689 Other abnormalities of breathing: Secondary | ICD-10-CM | POA: Diagnosis not present

## 2020-03-08 DIAGNOSIS — J9601 Acute respiratory failure with hypoxia: Secondary | ICD-10-CM

## 2020-03-08 DIAGNOSIS — Z79890 Hormone replacement therapy (postmenopausal): Secondary | ICD-10-CM | POA: Diagnosis not present

## 2020-03-08 DIAGNOSIS — R0602 Shortness of breath: Secondary | ICD-10-CM | POA: Diagnosis not present

## 2020-03-08 DIAGNOSIS — H409 Unspecified glaucoma: Secondary | ICD-10-CM | POA: Diagnosis present

## 2020-03-08 DIAGNOSIS — Z6832 Body mass index (BMI) 32.0-32.9, adult: Secondary | ICD-10-CM

## 2020-03-08 DIAGNOSIS — J1282 Pneumonia due to coronavirus disease 2019: Secondary | ICD-10-CM | POA: Diagnosis not present

## 2020-03-08 DIAGNOSIS — E871 Hypo-osmolality and hyponatremia: Secondary | ICD-10-CM

## 2020-03-08 DIAGNOSIS — E669 Obesity, unspecified: Secondary | ICD-10-CM | POA: Diagnosis present

## 2020-03-08 DIAGNOSIS — R7989 Other specified abnormal findings of blood chemistry: Secondary | ICD-10-CM | POA: Diagnosis not present

## 2020-03-08 DIAGNOSIS — Z90710 Acquired absence of both cervix and uterus: Secondary | ICD-10-CM

## 2020-03-08 DIAGNOSIS — F0390 Unspecified dementia without behavioral disturbance: Secondary | ICD-10-CM | POA: Diagnosis not present

## 2020-03-08 DIAGNOSIS — Z8541 Personal history of malignant neoplasm of cervix uteri: Secondary | ICD-10-CM

## 2020-03-08 DIAGNOSIS — R7303 Prediabetes: Secondary | ICD-10-CM | POA: Diagnosis not present

## 2020-03-08 LAB — COMPREHENSIVE METABOLIC PANEL
ALT: 48 U/L — ABNORMAL HIGH (ref 0–44)
AST: 32 U/L (ref 15–41)
Albumin: 2.8 g/dL — ABNORMAL LOW (ref 3.5–5.0)
Alkaline Phosphatase: 78 U/L (ref 38–126)
Anion gap: 11 (ref 5–15)
BUN: 16 mg/dL (ref 8–23)
CO2: 23 mmol/L (ref 22–32)
Calcium: 9.3 mg/dL (ref 8.9–10.3)
Chloride: 94 mmol/L — ABNORMAL LOW (ref 98–111)
Creatinine, Ser: 0.9 mg/dL (ref 0.44–1.00)
GFR, Estimated: >60 mL/min (ref >60)
Glucose, Bld: 136 mg/dL — ABNORMAL HIGH (ref 70–99)
Potassium: 4 mmol/L (ref 3.5–5.1)
Sodium: 128 mmol/L — ABNORMAL LOW (ref 135–145)
Total Bilirubin: 1 mg/dL (ref 0.3–1.2)
Total Protein: 7.2 g/dL (ref 6.5–8.1)

## 2020-03-08 LAB — CBC WITH DIFFERENTIAL/PLATELET
Abs Immature Granulocytes: 0.12 10*3/uL — ABNORMAL HIGH (ref 0.00–0.07)
Basophils Absolute: 0 10*3/uL (ref 0.0–0.1)
Basophils Relative: 0 %
Eosinophils Absolute: 0.1 10*3/uL (ref 0.0–0.5)
Eosinophils Relative: 1 %
HCT: 38.7 % (ref 36.0–46.0)
Hemoglobin: 12.7 g/dL (ref 12.0–15.0)
Immature Granulocytes: 1 %
Lymphocytes Relative: 8 %
Lymphs Abs: 1.3 10*3/uL (ref 0.7–4.0)
MCH: 30.3 pg (ref 26.0–34.0)
MCHC: 32.8 g/dL (ref 30.0–36.0)
MCV: 92.4 fL (ref 80.0–100.0)
Monocytes Absolute: 1.3 10*3/uL — ABNORMAL HIGH (ref 0.1–1.0)
Monocytes Relative: 8 %
Neutro Abs: 13.5 10*3/uL — ABNORMAL HIGH (ref 1.7–7.7)
Neutrophils Relative %: 82 %
Platelets: 224 10*3/uL (ref 150–400)
RBC: 4.19 MIL/uL (ref 3.87–5.11)
RDW: 13.6 % (ref 11.5–15.5)
WBC: 16.4 10*3/uL — ABNORMAL HIGH (ref 4.0–10.5)
nRBC: 0 % (ref 0.0–0.2)

## 2020-03-08 LAB — FERRITIN: Ferritin: 909 ng/mL — ABNORMAL HIGH (ref 11–307)

## 2020-03-08 LAB — I-STAT CHEM 8, ED
BUN: 16 mg/dL (ref 8–23)
Calcium, Ion: 1.24 mmol/L (ref 1.15–1.40)
Chloride: 96 mmol/L — ABNORMAL LOW (ref 98–111)
Creatinine, Ser: 0.8 mg/dL (ref 0.44–1.00)
Glucose, Bld: 140 mg/dL — ABNORMAL HIGH (ref 70–99)
HCT: 42 % (ref 36.0–46.0)
Hemoglobin: 14.3 g/dL (ref 12.0–15.0)
Potassium: 4.2 mmol/L (ref 3.5–5.1)
Sodium: 130 mmol/L — ABNORMAL LOW (ref 135–145)
TCO2: 25 mmol/L (ref 22–32)

## 2020-03-08 LAB — LACTIC ACID, PLASMA
Lactic Acid, Venous: 1.9 mmol/L (ref 0.5–1.9)
Lactic Acid, Venous: 0.8 mmol/L (ref 0.5–1.9)

## 2020-03-08 LAB — D-DIMER, QUANTITATIVE: D-Dimer, Quant: 4.76 ug/mL-FEU — ABNORMAL HIGH (ref 0.00–0.50)

## 2020-03-08 LAB — TRIGLYCERIDES: Triglycerides: 70 mg/dL (ref <150)

## 2020-03-08 LAB — FIBRINOGEN: Fibrinogen: >800 mg/dL — ABNORMAL HIGH (ref 210–475)

## 2020-03-08 LAB — BRAIN NATRIURETIC PEPTIDE: B Natriuretic Peptide: 223.7 pg/mL — ABNORMAL HIGH (ref 0.0–100.0)

## 2020-03-08 LAB — C-REACTIVE PROTEIN: CRP: 31 mg/dL — ABNORMAL HIGH (ref <1.0)

## 2020-03-08 LAB — BLOOD GAS, VENOUS
Acid-base deficit: 0.5 mmol/L (ref 0.0–2.0)
Bicarbonate: 24.7 mmol/L (ref 20.0–28.0)
O2 Saturation: 86.4 %
Patient temperature: 98.6
pCO2, Ven: 45 mmHg (ref 44.0–60.0)
pH, Ven: 7.358 (ref 7.250–7.430)
pO2, Ven: 57.7 mmHg — ABNORMAL HIGH (ref 32.0–45.0)

## 2020-03-08 LAB — LACTATE DEHYDROGENASE: LDH: 115 U/L (ref 98–192)

## 2020-03-08 LAB — CBG MONITORING, ED: Glucose-Capillary: 209 mg/dL — ABNORMAL HIGH (ref 70–99)

## 2020-03-08 LAB — TROPONIN I (HIGH SENSITIVITY)
Troponin I (High Sensitivity): 2 ng/L (ref <18)
Troponin I (High Sensitivity): 4 ng/L (ref <18)

## 2020-03-08 LAB — RESP PANEL BY RT-PCR (FLU A&B, COVID) ARPGX2
Influenza A by PCR: NEGATIVE
Influenza B by PCR: NEGATIVE
SARS Coronavirus 2 by RT PCR: POSITIVE — AB

## 2020-03-08 LAB — PROCALCITONIN: Procalcitonin: 0.5 ng/mL

## 2020-03-08 MED ORDER — LITHIUM CARBONATE ER 300 MG PO TBCR
300.0000 mg | EXTENDED_RELEASE_TABLET | Freq: Every day | ORAL | Status: DC
  Administered 2020-03-09 – 2020-03-14 (×6): 300 mg via ORAL
  Filled 2020-03-08 (×6): qty 1

## 2020-03-08 MED ORDER — VANCOMYCIN HCL IN DEXTROSE 1-5 GM/200ML-% IV SOLN
1000.0000 mg | Freq: Once | INTRAVENOUS | Status: AC
  Administered 2020-03-08: 1000 mg via INTRAVENOUS
  Filled 2020-03-08: qty 200

## 2020-03-08 MED ORDER — SODIUM CHLORIDE 0.9 % IV SOLN: 100.0000 mg | Freq: Every day | INTRAVENOUS | Status: DC

## 2020-03-08 MED ORDER — IOHEXOL 350 MG/ML SOLN
100.0000 mL | Freq: Once | INTRAVENOUS | Status: AC | PRN
  Administered 2020-03-08: 100 mL via INTRAVENOUS

## 2020-03-08 MED ORDER — SODIUM CHLORIDE 0.9 % IV SOLN
2.0000 g | Freq: Once | INTRAVENOUS | Status: AC
  Administered 2020-03-08: 2 g via INTRAVENOUS
  Filled 2020-03-08: qty 2

## 2020-03-08 MED ORDER — ACETAMINOPHEN 325 MG PO TABS
650.0000 mg | ORAL_TABLET | Freq: Four times a day (QID) | ORAL | Status: DC | PRN
  Administered 2020-03-09 – 2020-03-12 (×3): 650 mg via ORAL
  Filled 2020-03-08 (×3): qty 2

## 2020-03-08 MED ORDER — SODIUM CHLORIDE 0.9 % IV SOLN
2.0000 g | INTRAVENOUS | Status: AC
  Administered 2020-03-08 – 2020-03-13 (×5): 2 g via INTRAVENOUS
  Filled 2020-03-08: qty 2
  Filled 2020-03-08 (×3): qty 20
  Filled 2020-03-08: qty 2

## 2020-03-08 MED ORDER — SODIUM CHLORIDE 0.9 % IV SOLN
200.0000 mg | Freq: Once | INTRAVENOUS | Status: AC
  Administered 2020-03-08: 200 mg via INTRAVENOUS
  Filled 2020-03-08: qty 200

## 2020-03-08 MED ORDER — DEXAMETHASONE SODIUM PHOSPHATE 4 MG/ML IJ SOLN
4.0000 mg | Freq: Once | INTRAMUSCULAR | Status: AC
  Administered 2020-03-08: 4 mg via INTRAVENOUS
  Filled 2020-03-08: qty 1

## 2020-03-08 MED ORDER — SODIUM CHLORIDE 0.9 % IV SOLN: INTRAVENOUS | Status: DC

## 2020-03-08 MED ORDER — ONDANSETRON HCL 4 MG PO TABS: 4.0000 mg | ORAL_TABLET | Freq: Four times a day (QID) | ORAL | Status: DC | PRN

## 2020-03-08 MED ORDER — LEVOTHYROXINE SODIUM 50 MCG PO TABS
50.0000 ug | ORAL_TABLET | Freq: Every day | ORAL | Status: DC
  Administered 2020-03-09 – 2020-03-14 (×6): 50 ug via ORAL
  Filled 2020-03-08 (×6): qty 1

## 2020-03-08 MED ORDER — PREDNISONE 50 MG PO TABS
50.0000 mg | ORAL_TABLET | Freq: Every day | ORAL | Status: DC
  Administered 2020-03-12: 50 mg via ORAL
  Filled 2020-03-08: qty 1

## 2020-03-08 MED ORDER — INSULIN ASPART 100 UNIT/ML ~~LOC~~ SOLN
0.0000 [IU] | Freq: Three times a day (TID) | SUBCUTANEOUS | Status: DC
  Filled 2020-03-08: qty 0.09

## 2020-03-08 MED ORDER — FLUOXETINE HCL 20 MG PO CAPS
20.0000 mg | ORAL_CAPSULE | Freq: Every day | ORAL | Status: DC
  Administered 2020-03-09 – 2020-03-14 (×6): 20 mg via ORAL
  Filled 2020-03-08 (×6): qty 1

## 2020-03-08 MED ORDER — ONDANSETRON HCL 4 MG/2ML IJ SOLN: 4.0000 mg | Freq: Four times a day (QID) | INTRAMUSCULAR | Status: DC | PRN

## 2020-03-08 MED ORDER — ALBUTEROL SULFATE HFA 108 (90 BASE) MCG/ACT IN AERS
2.0000 | INHALATION_SPRAY | Freq: Once | RESPIRATORY_TRACT | Status: AC
  Administered 2020-03-08: 2 via RESPIRATORY_TRACT
  Filled 2020-03-08: qty 6.7

## 2020-03-08 MED ORDER — ENOXAPARIN SODIUM 40 MG/0.4ML ~~LOC~~ SOLN
40.0000 mg | SUBCUTANEOUS | Status: DC
  Administered 2020-03-08 – 2020-03-13 (×6): 40 mg via SUBCUTANEOUS
  Filled 2020-03-08 (×6): qty 0.4

## 2020-03-08 MED ORDER — METHYLPREDNISOLONE SODIUM SUCC 40 MG IJ SOLR
40.0000 mg | Freq: Two times a day (BID) | INTRAMUSCULAR | Status: AC
  Administered 2020-03-08 – 2020-03-11 (×6): 40 mg via INTRAVENOUS
  Filled 2020-03-08 (×6): qty 1

## 2020-03-08 MED ORDER — SODIUM CHLORIDE 0.9 % IV SOLN
500.0000 mg | INTRAVENOUS | Status: DC
  Administered 2020-03-08 – 2020-03-10 (×3): 500 mg via INTRAVENOUS
  Filled 2020-03-08 (×3): qty 500

## 2020-03-08 NOTE — ED Triage Notes (Signed)
SOB since dx with COVID. Was sent [place] mid November. On home O2. Was found by EMS on 5L [place] sating 85%. EMS placed pt on non-rebreather. SPO2 95%

## 2020-03-08 NOTE — ED Notes (Signed)
Patient transported to CT 

## 2020-03-08 NOTE — ED Provider Notes (Signed)
[facility] DEPT Provider Note   CSN: [id number] Arrival date & time: 03/08/20  1549     History Chief Complaint  Patient presents with   Shortness of Breath    Laura Drake is a 79 y.o. female hx of depression, cervical cancer here presenting with shortness of breath and increasing oxygen requirement. Was diagnosed with Covid about a month ago. Patient went to [facility] on November 14 and was diagnosed with multifocal pneumonia. Patient was supposed to be admitted but decided to go home on oxygen. Patient is on 4 to 5 L at home. Patient has worsening shortness of breath and EMS noticed that she was hypoxic to 85% on 5 L and put her on nonrebreather. Patient unable to give much history due to shortness of breath  The history is provided by the patient.       Past Medical History:  Diagnosis Date   Abnormal Pap smear of cervix    Adenomatous polyp of colon 2013   Anxiety    Bipolar affective disorder ([place])    dx at age 82    Cancer [facility]) 1979   cervical cancer   Depression    h/o suicidal attempt 1987   DJD (degenerative joint disease)    Fracture of leg 1991   L leg, cast x 6 months    Glaucoma    Hernia, inguinal    per CT 3-12   Hyperlipidemia    OSA (obstructive sleep apnea) 09-24-09   + sleep study, "moderate"   S/P colonoscopy 03-13-2000   hemorrhoids, tortous sigmoid, Dr Watt Climes    Patient Active Problem List   Diagnosis Date Noted   Vitamin D deficiency 11/27/2016   Lithium use 07/23/2016   Functional constipation 03/28/2016   Family history of colon cancer 01/31/2016   Primary open angle glaucoma of right eye, moderate stage 05/18/2015   Primary open angle glaucoma of left eye, severe stage 05/18/2015   Nuclear cataract of both eyes 05/18/2015   Left inguinal hernia 10/04/2013   Hyperglycemia 10/28/2010   Abdominal pain 07/08/2010   Hypothyroidism    Bipolar affective disorder ([place])     Hyperlipidemia    DJD (degenerative joint disease)    OSA (obstructive sleep apnea) 09/24/2009    Past Surgical History:  Procedure Laterality Date   RADICAL HYSTERECTOMY  1979     OB History    Gravida  3   Para  3   Term  3   Preterm      AB      Living  3     SAB      TAB      Ectopic      Multiple      Live Births              Family History  Problem Relation Age of Onset   Bipolar disorder Father    Heart disease Father    Parkinsonism Mother    Arthritis Mother    Other Sister        "blood cancer"   Diabetes Sister     Social History   Tobacco Use   Smoking status: Never Smoker   Smokeless tobacco: Never Used  Substance Use Topics   Alcohol use: Yes    Comment: 1 a month   Drug use: No    Home Medications Prior to Admission medications   Medication Sig Start Date End Date Taking? Authorizing Provider  clindamycin (CLEOCIN) 300 MG capsule  Take 1 capsule (300 mg total) by mouth 2 (two) times daily. 06/15/17   McVey, Gelene Mink, PA-C  FLUoxetine (PROZAC) 10 MG capsule Take 10 mg by mouth daily.    [provider]  FLUoxetine (PROZAC) 20 MG capsule TAKE ONE CAPSULE BY MOUTH IN THE MORNING WITH FOOD 06/05/17   [provider]  lamoTRIgine (LAMICTAL) 150 MG tablet Take 150 mg by mouth every morning. 06/07/17   [provider]  lamoTRIgine (LAMICTAL) 25 MG tablet Take 25 mg by mouth daily.     [provider]  latanoprost (XALATAN) 0.005 % ophthalmic solution 2 drops at bedtime.    [provider]  levothyroxine (SYNTHROID, LEVOTHROID) 50 MCG tablet Take 1 tablet (50 mcg total) by mouth daily. 01/09/17   Harrison Mons, PA  lithium (LITHOBID) 300 MG CR tablet Take 300 mg by mouth daily.      [provider]  nystatin cream (MYCOSTATIN) Apply 1 application topically 2 (two) times daily. 06/30/17   Harrison Mons, PA  nystatin-triamcinolone (MYCOLOG II) cream Apply 1 application  topically 2 (two) times daily. Apply to affected area BID for 5 days 07/23/16   Harrison Mons, PA  Polyethylene Glycol 3350 (PEG 3350) POWD Take by mouth.    [provider]  Probiotic Product (PROBIOTIC DAILY PO) Take by mouth.    [provider]  triamcinolone cream (KENALOG) 0.1 % Apply 1 application topically 2 (two) times daily as needed. If use daily, take 1 week break every 2 weeks 06/30/17   Harrison Mons, PA  Vitamin D, Ergocalciferol, (DRISDOL) 50000 units CAPS capsule Take 1 capsule (50,000 Units total) by mouth every 7 (seven) days. 07/06/17   Harrison Mons, PA    Allergies    Amitiza [lubiprostone], Other, Sulfa antibiotics, and Tramadol  Review of Systems   Review of Systems  Respiratory: Positive for shortness of breath.   All other systems reviewed and are negative.   Physical Exam Updated Vital Signs BP 113/75 (BP Location: Left Arm)    Pulse (!) 108    Temp 99.3 F (37.4 C) (Oral)    Resp (!) 28    SpO2 96%   Physical Exam Vitals and nursing note reviewed.  Constitutional:      Comments: Tachypneic and short of breath  HENT:     Head: Normocephalic.     Mouth/Throat:     Mouth: Mucous membranes are moist.  Eyes:     Extraocular Movements: Extraocular movements intact.     Pupils: Pupils are equal, round, and reactive to light.  Cardiovascular:     Rate and Rhythm: Normal rate and regular rhythm.  Pulmonary:     Comments: Tachypneic and diminished throughout. Minimal wheezing as well Abdominal:     General: Bowel sounds are normal.     Palpations: Abdomen is soft.  Musculoskeletal:        General: Normal range of motion.     Cervical back: Normal range of motion and neck supple.  Skin:    General: Skin is warm.     Capillary Refill: Capillary refill takes less than 2 seconds.  Neurological:     Comments: Confused but moving all extremities  Psychiatric:        Mood and Affect: Mood normal.     ED Results / Procedures /  Treatments   Labs (all labs ordered are listed, but only abnormal results are displayed) Labs Reviewed  RESP PANEL BY RT-PCR (FLU A&B, COVID) ARPGX2  CULTURE, BLOOD (  ROUTINE X 2)  CULTURE, BLOOD (ROUTINE X 2)  LACTIC ACID, PLASMA  LACTIC ACID, PLASMA  CBC WITH DIFFERENTIAL/PLATELET  COMPREHENSIVE METABOLIC PANEL  D-DIMER, QUANTITATIVE (NOT AT [facility])  PROCALCITONIN  LACTATE DEHYDROGENASE  FERRITIN  TRIGLYCERIDES  FIBRINOGEN  C-REACTIVE PROTEIN  BRAIN NATRIURETIC PEPTIDE  BLOOD GAS, VENOUS  LITHIUM LEVEL  I-STAT CHEM 8, ED  TROPONIN I (HIGH SENSITIVITY)    EKG EKG Interpretation  Date/Time:  Thursday March 08 2020 16:10:49 EST Ventricular Rate:  106 PR Interval:    QRS Duration: 74 QT Interval:  324 QTC Calculation: 431 R Axis:   3 Text Interpretation: Sinus tachycardia Low voltage, precordial leads Abnormal R-wave progression, early transition No previous ECGs available Confirmed by Wandra Arthurs [phone]) on 03/08/2020 4:30:40 PM   Radiology No results found.  Procedures Procedures (including critical care time)  CRITICAL CARE Performed by: Wandra Arthurs   Total critical care time: 30 minutes  Critical care time was exclusive of separately billable procedures and treating other patients.  Critical care was necessary to treat or prevent imminent or life-threatening deterioration.  Critical care was time spent personally by me on the following activities: development of treatment plan with patient and/or surrogate as well as nursing, discussions with consultants, evaluation of patient's response to treatment, examination of patient, obtaining history from patient or surrogate, ordering and performing treatments and interventions, ordering and review of laboratory studies, ordering and review of radiographic studies, pulse oximetry and re-evaluation of patient's condition.  Medications Ordered in ED Medications  albuterol (VENTOLIN HFA) 108 (90 Base) MCG/ACT inhaler  2 puff (has no administration in time range)  dexamethasone (DECADRON) injection 4 mg (has no administration in time range)    ED Course  I have reviewed the triage vital signs and the nursing notes.  Pertinent labs & imaging results that were available during my care of the patient were reviewed by me and considered in my medical decision making (see chart for details).    MDM Rules/Calculators/A&P                         Giovana Faciane is a 79 y.o. female here presenting with shortness of breath. Patient has worsening shortness of breath and now is on nonrebreather. Patient's Covid diagnosis is about a month ago. She had a CTA that did not show a PE 2 weeks ago but have multifocal pneumonia. Given worsening hypoxia, consider PE vs pneumonia. Plan to get Covid preadmission labs and repeat CT angio chest   8:48 PM CTA showed multifocal pneumonia. Stable on 10 L High flow. COVID still positive. Given IV abx. Hospitalist to admit to stepdown   Final Clinical Impression(s) / ED Diagnoses Final diagnoses:  None    Rx / DC Orders ED Discharge Orders    None       Drenda Freeze, MD 03/08/20 2049

## 2020-03-08 NOTE — H&P (Signed)
History and Physical    Laura Drake BJY:[id number] DOB: 10-22-1940 DOA: 03/08/2020  PCP: Pcp, No  Patient coming from: Home  I have personally briefly reviewed patient's old medical records in [place]  Chief Complaint: SOB  HPI: Laura Drake is a 79 y.o. female with medical history significant of BPD, dementia.  Pt unvaccinated to [place].  Pt with onset of COVID symptoms on 11/1.  Pt with severe SOB, presented to ED at [facility] on 11/14.  New 4-5L O2 requirement, COVID+, multifocal PNA.  Admission recommended but family members present apparently refused.  Pt sent home on 4-5L O2.  Patient with worsening SOB and O2 requirement over past couple of days.  Now 10L.  Brought in to ED here at [facility] at insistence of HCPOA (different daughter than POA daughter).  Pt has ongoing cough that has been non-productive.  Symptoms severe, persistent, worsening.   ED Course: Sodium 128 (was 125 on 11/14), Procalcitonin 0.5, CRP 31, WBC 16.4k.  D.Dimer 4.76, COVID test still positive.  CTA chest: 1) No PE 2) multifocal PNA  Pt put on cefepime + Vanc in ED for concern of superimposed bacterial PNA on top of COVID-19.   Review of Systems: As per HPI, otherwise all review of systems negative.  Past Medical History:  Diagnosis Date  . Abnormal Pap smear of cervix   . Adenomatous polyp of colon 2013  . Anxiety   . Bipolar affective disorder ([place])    dx at age 77   . Cancer [facility]) 1979   cervical cancer  . Depression    h/o suicidal attempt 24  . DJD (degenerative joint disease)   . Fracture of leg 1991   L leg, cast x 6 months   . Glaucoma   . Hernia, inguinal    per CT 3-12  . Hyperlipidemia   . OSA (obstructive sleep apnea) 09-24-09   + sleep study, "moderate"  . S/P colonoscopy 03-13-2000   hemorrhoids, tortous sigmoid, Dr Watt Climes    Past Surgical History:  Procedure Laterality Date  . RADICAL HYSTERECTOMY  1979     reports that she has never smoked. She has never  used smokeless tobacco. She reports current alcohol use. She reports that she does not use drugs.  Allergies  Allergen Reactions  . Amitiza [Lubiprostone] Diarrhea, Nausea And Vomiting and Other (See Comments)    Bad pains  . Other Swelling    Shellfish  . Sulfa Antibiotics   . Tramadol Rash    Family History  Problem Relation Age of Onset  . Bipolar disorder Father   . Heart disease Father   . Parkinsonism Mother   . Arthritis Mother   . Other Sister        "blood cancer"  . Diabetes Sister      Prior to Admission medications   Medication Sig Start Date End Date Taking? Authorizing Provider  clindamycin (CLEOCIN) 300 MG capsule Take 1 capsule (300 mg total) by mouth 2 (two) times daily. 06/15/17   McVey, Gelene Mink, PA-C  FLUoxetine (PROZAC) 10 MG capsule Take 10 mg by mouth daily.    [provider]  FLUoxetine (PROZAC) 20 MG capsule TAKE ONE CAPSULE BY MOUTH IN THE MORNING WITH FOOD 06/05/17   [provider]  lamoTRIgine (LAMICTAL) 150 MG tablet Take 150 mg by mouth every morning. 06/07/17   [provider]  lamoTRIgine (LAMICTAL) 25 MG tablet Take 25 mg by mouth daily.     [provider]  latanoprost (XALATAN) 0.005 % ophthalmic solution 2 drops at bedtime.    [provider]  levothyroxine (SYNTHROID, LEVOTHROID) 50 MCG tablet Take 1 tablet (50 mcg total) by mouth daily. 01/09/17   Harrison Mons, PA  lithium (LITHOBID) 300 MG CR tablet Take 300 mg by mouth daily.      [provider]  nystatin cream (MYCOSTATIN) Apply 1 application topically 2 (two) times daily. 06/30/17   Harrison Mons, PA  nystatin-triamcinolone (MYCOLOG II) cream Apply 1 application topically 2 (two) times daily. Apply to affected area BID for 5 days 07/23/16   Harrison Mons, PA  Polyethylene Glycol 3350 (PEG 3350) POWD Take by mouth.    [provider]  Probiotic Product (PROBIOTIC DAILY PO) Take by mouth.    [provider]    triamcinolone cream (KENALOG) 0.1 % Apply 1 application topically 2 (two) times daily as needed. If use daily, take 1 week break every 2 weeks 06/30/17   Harrison Mons, PA  Vitamin D, Ergocalciferol, (DRISDOL) 50000 units CAPS capsule Take 1 capsule (50,000 Units total) by mouth every 7 (seven) days. 07/06/17   Harrison Mons, PA    Physical Exam: Vitals:   03/08/20 1900 03/08/20 1930 03/08/20 2100 03/08/20 2130  BP: (!) 99/59 105/67 (!) 93/45 (!) 112/56  Pulse: 93 90 83 72  Resp: (!) 28 (!) 27 (!) 22 20  Temp:   98.9 F (37.2 C)   TempSrc:      SpO2: 92% 93% 97% 93%    Constitutional: NAD, calm, comfortable Eyes: PERRL, lids and conjunctivae normal ENMT: Mucous membranes are moist. Posterior pharynx clear of any exudate or lesions.Normal dentition.  Neck: normal, supple, no masses, no thyromegaly Respiratory: diminished throughout with minimal wheezing. Cardiovascular: Regular rate and rhythm, no murmurs / rubs / gallops. No extremity edema. 2+ pedal pulses. No carotid bruits.  Abdomen: no tenderness, no masses palpated. No hepatosplenomegaly. Bowel sounds positive.  Musculoskeletal: no clubbing / cyanosis. No joint deformity upper and lower extremities. Good ROM, no contractures. Normal muscle tone.  Skin: no rashes, lesions, ulcers. No induration Neurologic: CN 2-12 grossly intact. Sensation intact, DTR normal. Strength 5/5 in all 4.  Psychiatric: Awake, alert, mild confusion   Labs on Admission: I have personally reviewed following labs and imaging studies  CBC: Recent Labs  Lab 03/08/20 1630 03/08/20 1646  WBC 16.4*  --   NEUTROABS 13.5*  --   HGB 12.7 14.3  HCT 38.7 42.0  MCV 92.4  --   PLT 224  --    Basic Metabolic Panel: Recent Labs  Lab 03/08/20 1630 03/08/20 1646  NA 128* 130*  K 4.0 4.2  CL 94* 96*  CO2 23  --   GLUCOSE 136* 140*  BUN 16 16  CREATININE 0.90 0.80  CALCIUM 9.3  --    GFR: CrCl cannot be calculated (Unknown ideal weight.). Liver  Function Tests: Recent Labs  Lab 03/08/20 1630  AST 32  ALT 48*  ALKPHOS 78  BILITOT 1.0  PROT 7.2  ALBUMIN 2.8*   No results for input(s): LIPASE, AMYLASE in the last 168 hours. No results for input(s): AMMONIA in the last 168 hours. Coagulation Profile: No results for input(s): INR, PROTIME in the last 168 hours. Cardiac Enzymes: No results for input(s): CKTOTAL, CKMB, CKMBINDEX, TROPONINI in the last 168 hours. BNP (last 3 results) No results for input(s): PROBNP in the last 8760 hours. HbA1C: No results for input(s): HGBA1C in the last 72 hours. CBG: No results  for input(s): GLUCAP in the last 168 hours. Lipid Profile: Recent Labs    03/08/20 1630  TRIG 70   Thyroid Function Tests: No results for input(s): TSH, T4TOTAL, FREET4, T3FREE, THYROIDAB in the last 72 hours. Anemia Panel: Recent Labs    03/08/20 1630  FERRITIN 909*   Urine analysis:    Component Value Date/Time   COLORURINE YELLOW 10/14/2011 2221   APPEARANCEUR TURBID (A) 10/14/2011 2221   LABSPEC >1.046 (H) 10/14/2011 2221   PHURINE 6.0 10/14/2011 2221   GLUCOSEU NEGATIVE 10/14/2011 2221   HGBUR NEGATIVE 10/14/2011 2221   BILIRUBINUR Neg 12/11/2011 1044   KETONESUR NEGATIVE 10/14/2011 2221   PROTEINUR Neg 12/11/2011 1044   PROTEINUR NEGATIVE 10/14/2011 2221   UROBILINOGEN 0.2 12/11/2011 1044   UROBILINOGEN 1.0 10/14/2011 2221   NITRITE Neg 12/11/2011 1044   NITRITE NEGATIVE 10/14/2011 2221   LEUKOCYTESUR moderate (2+) 12/11/2011 1044    Radiological Exams on Admission: CT Angio Chest PE W and/or Wo Contrast  Result Date: 03/08/2020 CLINICAL DATA:  Shortness of breath EXAM: CT ANGIOGRAPHY CHEST WITH CONTRAST TECHNIQUE: Multidetector CT imaging of the chest was performed using the standard protocol during bolus administration of intravenous contrast. Multiplanar CT image reconstructions and MIPs were obtained to evaluate the vascular anatomy. CONTRAST:  143mL OMNIPAQUE IOHEXOL 350 MG/ML SOLN  COMPARISON:  10/14/2011 FINDINGS: Cardiovascular: Contrast injection is sufficient to demonstrate satisfactory opacification of the pulmonary arteries to the segmental level. There is no pulmonary embolus or evidence of right heart strain. The size of the main pulmonary artery is normal. Heart size is normal, with no pericardial effusion. The course and caliber of the aorta are normal. There is mild atherosclerotic calcification. Opacification decreased due to pulmonary arterial phase contrast bolus timing. Mediastinum/Nodes: No mediastinal, hilar or axillary lymphadenopathy. Normal visualized thyroid. Lungs/Pleura: Multifocal, peripheral predominant ground glass opacities. No pleural effusion. Upper Abdomen: Contrast bolus timing is not optimized for evaluation of the abdominal organs. The visualized portions of the organs of the upper abdomen are normal. Musculoskeletal: No chest wall abnormality. No bony spinal canal stenosis. Review of the MIP images confirms the above findings. IMPRESSION: 1. No pulmonary embolus or acute aortic syndrome. 2. Multifocal, peripheral predominant ground glass opacities, most consistent with multifocal pneumonia. Aortic Atherosclerosis (ICD10-I70.0). Electronically Signed   By: Ulyses Jarred M.D.   On: 03/08/2020 20:25   DG Chest Port 1 View  Result Date: 03/08/2020 CLINICAL DATA:  Shortness of breath. EXAM: PORTABLE CHEST 1 VIEW COMPARISON:  CT chest 10/15/2011 FINDINGS: Cardiac enlargement. Decreased lung volumes. Diffuse increase interstitial and airspace opacities within the left upper lobe and left lower lobe are identified compatible with multifocal pneumonia. IMPRESSION: Multifocal interstitial and airspace densities within the left lung compatible with multifocal pneumonia. Electronically Signed   By: Kerby Moors M.D.   On: 03/08/2020 17:37    EKG: Independently reviewed.  Assessment/Plan Principal Problem:   Acute respiratory distress syndrome (ARDS) due to  COVID-19 virus [facility]) Active Problems:   Bipolar affective disorder (HCC)   Bacterial pneumonia   Acute respiratory failure with hypoxia (HCC)   Hyponatremia   Dementia without behavioral disturbance ([place])    1. Acute respiratory failure with Hypoxia - 1. Suspect patient may have superimposed bacterial PNA on top of known COVID PNA 2. Would be unusual for active viral COVID-19 to be worsening respiratory function acutely at this late stage (1 month since onset)  2 weeks since the onset of O2 requirement from [place]. 3. Treating with rocephin / azithro  for superimposed bacterial PNA 4. Son apparently is very anti-remdesivir (son very right wing) with regards to [place].  Given that patient has had COVID for 1 month now, not clear that Remdesivir will add much anyhow at this late date so wont put patient on this. 5. Putting patient on steroids 6. Tele monitor, cont pulse ox 7. COVID pathway 8. Daily labs 9. CT neg for PE 2. Hyponatremia - 1. NS at 90 2. Keep eye on resp function with IVF 3. Repeat CMP in AM 3. BPD, dementia - 1. Cont home meds  DVT prophylaxis: Lovenox Code Status: DNR/DNI per daughter Family Communication: Spoke with HCPOA and daughter Reita Chard at [phone] Disposition Plan: Home after O2 requirement improved Consults called: None Admission status: Admit to inpatient  Severity of Illness: The appropriate patient status for this patient is INPATIENT. Inpatient status is judged to be reasonable and necessary in order to provide the required intensity of service to ensure the patient's safety. The patient's presenting symptoms, physical exam findings, and initial radiographic and laboratory data in the context of their chronic comorbidities is felt to place them at high risk for further clinical deterioration. Furthermore, it is not anticipated that the patient will be medically stable for discharge from the hospital within 2 midnights of admission. The following  factors support the patient status of inpatient.   IP status due to worsening O2 requirement, now 10L up from 4-5L on 11/14.  Suspect superimposed bacterial PNA at this stage.   * I certify that at the point of admission it is my clinical judgment that the patient will require inpatient hospital care spanning beyond 2 midnights from the point of admission due to high intensity of service, high risk for further deterioration and high frequency of surveillance required.*    Kerin Cecchi M. DO [facility]  How to contact the [facility] Attending or Consulting provider [place] or covering provider during after hours [place], for this patient?  1. Check the care team in [facility] and look for a) attending/consulting TRH provider listed and b) the [facility] team listed 2. Log into [email or web address]  Amion Physician Scheduling and messaging for groups and whole hospitals  On call and physician scheduling software for group practices, residents, hospitalists and other medical providers for call, clinic, rotation and shift schedules. OnCall [employer] is a hospital-wide system for scheduling doctors and paging doctors on call. EasyPlot is for scientific plotting and data analysis.  [email or web address]  and use [place]'s universal password to access. If you do not have the password, please contact the hospital operator.  3. Locate the [facility] provider you are looking for under [facility] and page to a number that you can be directly reached. 4. If you still have difficulty reaching the provider, please page the [facility] (Director on Call) for the Hospitalists listed on amion for assistance.  03/08/2020, 9:56 PM

## 2020-03-09 ENCOUNTER — Inpatient Hospital Stay (HOSPITAL_COMMUNITY): Payer: Medicare Other

## 2020-03-09 ENCOUNTER — Other Ambulatory Visit: Payer: Self-pay

## 2020-03-09 ENCOUNTER — Encounter (HOSPITAL_COMMUNITY): Payer: Self-pay | Admitting: Internal Medicine

## 2020-03-09 DIAGNOSIS — F319 Bipolar disorder, unspecified: Secondary | ICD-10-CM

## 2020-03-09 DIAGNOSIS — R7989 Other specified abnormal findings of blood chemistry: Secondary | ICD-10-CM | POA: Diagnosis not present

## 2020-03-09 DIAGNOSIS — J80 Acute respiratory distress syndrome: Secondary | ICD-10-CM

## 2020-03-09 LAB — GLUCOSE, CAPILLARY
Glucose-Capillary: 241 mg/dL — ABNORMAL HIGH (ref 70–99)
Glucose-Capillary: 218 mg/dL — ABNORMAL HIGH (ref 70–99)

## 2020-03-09 LAB — COMPREHENSIVE METABOLIC PANEL
ALT: 41 U/L (ref 0–44)
AST: 23 U/L (ref 15–41)
Albumin: 2.6 g/dL — ABNORMAL LOW (ref 3.5–5.0)
Alkaline Phosphatase: 72 U/L (ref 38–126)
Anion gap: 10 (ref 5–15)
BUN: 19 mg/dL (ref 8–23)
CO2: 22 mmol/L (ref 22–32)
Calcium: 9.3 mg/dL (ref 8.9–10.3)
Chloride: 101 mmol/L (ref 98–111)
Creatinine, Ser: 0.93 mg/dL (ref 0.44–1.00)
GFR, Estimated: >60 mL/min (ref >60)
Glucose, Bld: 205 mg/dL — ABNORMAL HIGH (ref 70–99)
Potassium: 4.6 mmol/L (ref 3.5–5.1)
Sodium: 133 mmol/L — ABNORMAL LOW (ref 135–145)
Total Bilirubin: 0.4 mg/dL (ref 0.3–1.2)
Total Protein: 6.7 g/dL (ref 6.5–8.1)

## 2020-03-09 LAB — CBC WITH DIFFERENTIAL/PLATELET
Abs Immature Granulocytes: 0.11 10*3/uL — ABNORMAL HIGH (ref 0.00–0.07)
Basophils Absolute: 0 10*3/uL (ref 0.0–0.1)
Basophils Relative: 0 %
Eosinophils Absolute: 0 10*3/uL (ref 0.0–0.5)
Eosinophils Relative: 0 %
HCT: 40.4 % (ref 36.0–46.0)
Hemoglobin: 12.9 g/dL (ref 12.0–15.0)
Immature Granulocytes: 1 %
Lymphocytes Relative: 5 %
Lymphs Abs: 0.6 10*3/uL — ABNORMAL LOW (ref 0.7–4.0)
MCH: 29.7 pg (ref 26.0–34.0)
MCHC: 31.9 g/dL (ref 30.0–36.0)
MCV: 92.9 fL (ref 80.0–100.0)
Monocytes Absolute: 0.2 10*3/uL (ref 0.1–1.0)
Monocytes Relative: 2 %
Neutro Abs: 11.3 10*3/uL — ABNORMAL HIGH (ref 1.7–7.7)
Neutrophils Relative %: 92 %
Platelets: 253 10*3/uL (ref 150–400)
RBC: 4.35 MIL/uL (ref 3.87–5.11)
RDW: 13.6 % (ref 11.5–15.5)
WBC: 12.2 10*3/uL — ABNORMAL HIGH (ref 4.0–10.5)
nRBC: 0 % (ref 0.0–0.2)

## 2020-03-09 LAB — C-REACTIVE PROTEIN: CRP: 28.1 mg/dL — ABNORMAL HIGH (ref <1.0)

## 2020-03-09 LAB — HIV ANTIBODY (ROUTINE TESTING W REFLEX): HIV Screen 4th Generation wRfx: NONREACTIVE

## 2020-03-09 LAB — HEMOGLOBIN A1C
Hgb A1c MFr Bld: 6.3 % — ABNORMAL HIGH (ref 4.8–5.6)
Mean Plasma Glucose: 134.11 mg/dL

## 2020-03-09 LAB — CBG MONITORING, ED: Glucose-Capillary: 168 mg/dL — ABNORMAL HIGH (ref 70–99)

## 2020-03-09 LAB — D-DIMER, QUANTITATIVE: D-Dimer, Quant: 4.69 ug/mL-FEU — ABNORMAL HIGH (ref 0.00–0.50)

## 2020-03-09 LAB — STREP PNEUMONIAE URINARY ANTIGEN: Strep Pneumo Urinary Antigen: NEGATIVE

## 2020-03-09 NOTE — Progress Notes (Signed)
Pt states she do not take insulin at home and refused coverage, noted.

## 2020-03-09 NOTE — Plan of Care (Signed)
  Problem: Respiratory: Goal: Will maintain a patent airway Outcome: Progressing Goal: Complications related to the disease process, condition or treatment will be avoided or minimized Outcome: Progressing   Problem: Education: Goal: Knowledge of risk factors and measures for prevention of condition will improve Outcome: Progressing   Problem: Coping: Goal: Psychosocial and spiritual needs will be supported Outcome: Progressing   Problem: Respiratory: Goal: Will maintain a patent airway Outcome: Progressing Goal: Complications related to the disease process, condition or treatment will be avoided or minimized Outcome: Progressing

## 2020-03-09 NOTE — ED Notes (Signed)
Pt's daughter arrived to ED visibly upset that she could not see pt. Writer explained that per hospital protocol, pt unable to have visitors due to covid status. Daughter expressed numerous times the fact that she is a Marine scientist and has been caring for pt, but this writer defaulted to hospital protocol regarding visitations.

## 2020-03-09 NOTE — Progress Notes (Signed)
Lower extremity venous has been completed.   Preliminary results in CV Proc.   Laura Drake 03/09/2020 3:10 PM

## 2020-03-09 NOTE — ED Notes (Signed)
Pt's daughter Charlean Sanfilippo, [phone] requesting to be in room in patient once she is brought upstair. Inpatient RN informed

## 2020-03-09 NOTE — ED Notes (Signed)
Made second attempt to call report to [phone]. RN unavailable.

## 2020-03-09 NOTE — TOC Progression Note (Signed)
Transition of Care [facility]) - Progression Note    Patient Details  Name: Laura Drake MRN: [id number] Date of Birth: 15-Mar-1941  Transition of Care [facility]) CM/SW Contact  Purcell Mouton, RN Phone Number: 03/09/2020, 3:11 PM  Clinical Narrative:     Pt from home with adult children. TOC will follow for discharge needs.   Expected Discharge Plan: [place] Barriers to Discharge: No Barriers Identified  Expected Discharge Plan and Services Expected Discharge Plan: [place] arrangements for the past 2 months: Single Family Home                                       Social Determinants of Health (SDOH) Interventions    Readmission Risk Interventions No flowsheet data found.

## 2020-03-09 NOTE — Progress Notes (Signed)
PROGRESS NOTE  Laura Drake  MWU:[id number] DOB: 02-Oct-1940 DOA: 03/08/2020 PCP: Merryl Hacker, No   Brief Narrative: Laura Drake is an 79 y.o. female with a history of covid-19 pneumonia diagnosed 11/14 when she declined admission, discharged on 4-5L O2 who returned for care 12/2 with worsening shortness of breath found to have worsened hypoxia requiring 10L HFNC. In the ED CXR and subsequent CTA chest revealed multifocal peripheral-predominant opacities with no evidence of PE. CRP was 31 and PCT 0.50, WBC 16.4k (had been on steroids). D-dimer 4.76. Due to suspicion for superimposed bacterial pneumonia, cefepime and vancomycin were given in ED. Steroids also started for covid pneumonia. Remdesivir declined by family and not felt to confer much benefit, not started.   Assessment & Plan: Principal Problem:   Acute respiratory distress syndrome (ARDS) due to COVID-19 virus [facility]) Active Problems:   Bipolar affective disorder ([place])   Bacterial pneumonia   Acute respiratory failure with hypoxia (HCC)   Hyponatremia   Dementia without behavioral disturbance (HCC)  Acute hypoxemic respiratory failure due to covid-19 pneumonia with superimposed bacterial PNA: SARS-CoV-2 PCR positive on 11/14, symptoms began 11/1, not vaccinated, has been hypoxic since 11/14 with worsening 12/2.  - Potential benefits of remdesivir have not borne out in more recent studies, certainly would not be helpful at this late stage of covid infection.  - With elevated PCT and severely elevated CRP, bacterial pneumonia is the most likely cause of worsening >2 weeks after covid diagnosis. Covering with CTX, azithromycin. Since infiltrates are multifocal, MRSA is considered, though she doesn't have significant risk factors for this. Check PCR screen for hopeful r/o, consider restarting coverage if positive and/or clinically worsening.  - Sputum culture, blood cultures, urine legionella and strep antigens pending. - Continue steroids -  Immunomodulators not started due to concern for bacterial pneumonia. Could consider baricitinib now that she's requiring HFNC with severe CRP elevation.  - Encourage OOB, IS, FV, and awake proning if able - Continue airborne, contact precautions for 21 days from positive testing. - Monitor CMP and inflammatory markers - Enoxaparin prophylactic dose.   Elevated d-dimer:  - No PE on CTA - Check LE venous U/S, keep on ppx lovenox  Bipolar disorder:  - Continue lithium (level pending), SSRI - Pt is competent to make medical decisions at the time of my interview, though her mentation seems to wax and wane. 1 visitor has been allowed to assist with this.   Prediabetes, steroid-induced hyperglycemia: HbA1c 6.3%.  - SSI recommended, being declined by patient at this time.  Hypothyroidism:  - Continue synthroid.    Hyponatremia: Improving.  - Stop IVF for restrictive fluid strategy with covid patient.  Obesity: Estimated body mass index is 32.14 kg/m as calculated from the following:   Height as of 06/30/17: 5\' 3"  (1.6 m).   Weight as of this encounter: 82.3 kg.  DVT prophylaxis: Lovenox 40mg  q24h Code Status: DNR Family Communication: Daughter who is Therapist, sports and presents as Economist, confirmed with patient, is present at the bedside for update. She will be the point of contact for the patient. There appears to be discordant opinions among the patient's 3 children.  Disposition Plan:  Status is: Inpatient  Remains inpatient appropriate because:Inpatient level of care appropriate due to severity of illness  Dispo: The patient is from: Home              Anticipated d/c is to: TBD based on clinical progress  Anticipated d/c date is: 3 days              Patient currently is not medically stable to d/c.  Consultants:   None  Procedures:   None  Antimicrobials:  Remdesivir x1 12/2  Vancomycin, cefepime x1 12/2  Ceftriaxone, azithromycin 12/2 >>   Subjective: Feels about  the same from admission, shortness of breath is constant, moderate, worse with exertion, associated with weakness diffusely. Hates being in the hospital. She is in a very loud room.  Objective: Vitals:   03/09/20 1100 03/09/20 1130 03/09/20 1200 03/09/20 1406  BP: 126/71 118/72 (!) 149/81 128/61  Pulse: 79 79 91 87  Resp: (!) 26 (!) 24 (!) 22 (!) 28  Temp:    97.8 F (36.6 C)  TempSrc:      SpO2: 98% 96% 96% 93%  Weight:        Intake/Output Summary (Last 24 hours) at 03/09/2020 1418 Last data filed at 03/09/2020 0139 Gross per 24 hour  Intake 962.28 ml  Output --  Net 962.28 ml   Filed Weights   03/08/20 2200  Weight: 82.3 kg    Gen: Elderly female in no distress Pulm: Non-labored tachypnea, diminished throughout with some crackles.  CV: Regular rate and rhythm. No murmur, rub, or gallop. No JVD, no pitting pedal edema. GI: Abdomen soft, non-tender, non-distended, with normoactive bowel sounds. No organomegaly or masses felt. Ext: Warm, no deformities, decreased muscle bulk,  Skin: No rashes, lesions or ulcers on visualized skin. Neuro: Alert, oriented x4. Diffusely weak without focal deficits.  Psych: Judgement and insight appear intact, odd affect though euthymic mood.   Data Reviewed: I have personally reviewed following labs and imaging studies  CBC: Recent Labs  Lab 03/08/20 1630 03/08/20 1646 03/09/20 0630  WBC 16.4*  --  12.2*  NEUTROABS 13.5*  --  11.3*  HGB 12.7 14.3 12.9  HCT 38.7 42.0 40.4  MCV 92.4  --  92.9  PLT 224  --  425   Basic Metabolic Panel: Recent Labs  Lab 03/08/20 1630 03/08/20 1646 03/09/20 0630  NA 128* 130* 133*  K 4.0 4.2 4.6  CL 94* 96* 101  CO2 23  --  22  GLUCOSE 136* 140* 205*  BUN 16 16 19   CREATININE 0.90 0.80 0.93  CALCIUM 9.3  --  9.3   GFR: CrCl cannot be calculated (Unknown ideal weight.). Liver Function Tests: Recent Labs  Lab 03/08/20 1630 03/09/20 0630  AST 32 23  ALT 48* 41  ALKPHOS 78 72  BILITOT 1.0  0.4  PROT 7.2 6.7  ALBUMIN 2.8* 2.6*   No results for input(s): LIPASE, AMYLASE in the last 168 hours. No results for input(s): AMMONIA in the last 168 hours. Coagulation Profile: No results for input(s): INR, PROTIME in the last 168 hours. Cardiac Enzymes: No results for input(s): CKTOTAL, CKMB, CKMBINDEX, TROPONINI in the last 168 hours. BNP (last 3 results) No results for input(s): PROBNP in the last 8760 hours. HbA1C: Recent Labs    03/08/20 2315  HGBA1C 6.3*   CBG: Recent Labs  Lab 03/08/20 2314 03/09/20 0836  GLUCAP 209* 168*   Lipid Profile: Recent Labs    03/08/20 1630  TRIG 70   Thyroid Function Tests: No results for input(s): TSH, T4TOTAL, FREET4, T3FREE, THYROIDAB in the last 72 hours. Anemia Panel: Recent Labs    03/08/20 1630  FERRITIN 909*   Urine analysis:    Component Value Date/Time   COLORURINE YELLOW 10/14/2011  2221   APPEARANCEUR TURBID (A) 10/14/2011 2221   LABSPEC >1.046 (H) 10/14/2011 2221   PHURINE 6.0 10/14/2011 2221   GLUCOSEU NEGATIVE 10/14/2011 2221   HGBUR NEGATIVE 10/14/2011 2221   BILIRUBINUR Neg 12/11/2011 1044   KETONESUR NEGATIVE 10/14/2011 2221   PROTEINUR Neg 12/11/2011 1044   PROTEINUR NEGATIVE 10/14/2011 2221   UROBILINOGEN 0.2 12/11/2011 1044   UROBILINOGEN 1.0 10/14/2011 2221   NITRITE Neg 12/11/2011 1044   NITRITE NEGATIVE 10/14/2011 2221   LEUKOCYTESUR moderate (2+) 12/11/2011 1044   Recent Results (from the past 240 hour(s))  Resp Panel by RT-PCR (Flu A&B, Covid) Nasopharyngeal Swab     Status: Abnormal   Collection Time: 03/08/20  4:14 PM   Specimen: Nasopharyngeal Swab; Nasopharyngeal(NP) swabs in vial transport medium  Result Value Ref Range Status   SARS Coronavirus 2 by RT PCR POSITIVE (A) NEGATIVE Final    Comment: RESULT CALLED TO, READ BACK BY AND VERIFIED WITH: DR. Darl Householder AT 4403 ON 03/08/20 BY N.[email or web address] (NOTE) SARS-CoV-2 target nucleic acids are DETECTED.  The SARS-CoV-2 RNA is generally detectable  in upper respiratory specimens during the acute phase of infection. Positive results are indicative of the presence of the identified virus, but do not rule out bacterial infection or co-infection with other pathogens not detected by the test. Clinical correlation with patient history and other diagnostic information is necessary to determine patient infection status. The expected result is Negative.  Fact Sheet for Patients: EntrepreneurPulse.com.au  Fact Sheet for Healthcare Providers: IncredibleEmployment.be  This test is not yet approved or cleared by the [place] FDA and  has been authorized for detection and/or diagnosis of SARS-CoV-2 by FDA under an Emergency Use Authorization (EUA).  This EUA will remain in effect (meaning this test  can be used) for the duration of  the COVID-19 declaration under Section 564(b)(1) of the Act, 21 U.S.C. section 360bbb-3(b)(1), unless the authorization is terminated or revoked sooner.     Influenza A by PCR NEGATIVE NEGATIVE Final   Influenza B by PCR NEGATIVE NEGATIVE Final    Comment: (NOTE) The Xpert Xpress SARS-CoV-2/FLU/RSV plus assay is intended as an aid in the diagnosis of influenza from Nasopharyngeal swab specimens and should not be used as a sole basis for treatment. Nasal washings and aspirates are unacceptable for Xpert Xpress SARS-CoV-2/FLU/RSV testing.  Fact Sheet for Patients: EntrepreneurPulse.com.au  Fact Sheet for Healthcare Providers: IncredibleEmployment.be  This test is not yet approved or cleared by the [place] FDA and has been authorized for detection and/or diagnosis of SARS-CoV-2 by FDA under an Emergency Use Authorization (EUA). This EUA will remain in effect (meaning this test can be used) for the duration of the COVID-19 declaration under Section 564(b)(1) of the Act, 21 U.S.C. section 360bbb-3(b)(1), unless the authorization  is terminated or revoked.  Performed at [facility], [place] [address] [place], [place] [address]   Blood Culture (routine x 2)     Status: None (Preliminary result)   Collection Time: 03/08/20  4:20 PM   Specimen: BLOOD  Result Value Ref Range Status   Specimen Description   Final    BLOOD RIGHT ANTECUBITAL Performed at [place] [address] [place], [place] [address]    Special Requests   Final    BOTTLES DRAWN AEROBIC AND ANAEROBIC Blood Culture results may not be optimal due to an inadequate volume of blood received in culture bottles Performed at [place] [address] [place], [place] [address]  Culture   Final    NO GROWTH < 12 HOURS Performed at [facility], [place] [address], [place], [place] [address]    Report Status PENDING  Incomplete  Blood Culture (routine x 2)     Status: None (Preliminary result)   Collection Time: 03/08/20  4:30 PM   Specimen: BLOOD  Result Value Ref Range Status   Specimen Description   Final    BLOOD RIGHT WRIST Performed at [place] [address], [place], [place] [address]    Special Requests   Final    BOTTLES DRAWN AEROBIC AND ANAEROBIC Blood Culture adequate volume Performed at [place] [address], [place], [place] [address]    Culture   Final    NO GROWTH < 12 HOURS Performed at [facility] [address] [place],  [address]    Report Status PENDING  Incomplete      Radiology Studies: CT Angio Chest PE W and/or Wo Contrast  Result Date: 03/08/2020 CLINICAL DATA:  Shortness of breath EXAM: CT ANGIOGRAPHY CHEST WITH CONTRAST TECHNIQUE: Multidetector CT imaging of the chest was performed using the standard protocol during bolus administration of intravenous contrast. Multiplanar CT image reconstructions and MIPs were obtained to evaluate the vascular anatomy. CONTRAST:  159mL OMNIPAQUE  IOHEXOL 350 MG/ML SOLN COMPARISON:  10/14/2011 FINDINGS: Cardiovascular: Contrast injection is sufficient to demonstrate satisfactory opacification of the pulmonary arteries to the segmental level. There is no pulmonary embolus or evidence of right heart strain. The size of the main pulmonary artery is normal. Heart size is normal, with no pericardial effusion. The course and caliber of the aorta are normal. There is mild atherosclerotic calcification. Opacification decreased due to pulmonary arterial phase contrast bolus timing. Mediastinum/Nodes: No mediastinal, hilar or axillary lymphadenopathy. Normal visualized thyroid. Lungs/Pleura: Multifocal, peripheral predominant ground glass opacities. No pleural effusion. Upper Abdomen: Contrast bolus timing is not optimized for evaluation of the abdominal organs. The visualized portions of the organs of the upper abdomen are normal. Musculoskeletal: No chest wall abnormality. No bony spinal canal stenosis. Review of the MIP images confirms the above findings. IMPRESSION: 1. No pulmonary embolus or acute aortic syndrome. 2. Multifocal, peripheral predominant ground glass opacities, most consistent with multifocal pneumonia. Aortic Atherosclerosis (ICD10-I70.0). Electronically Signed   By: Ulyses Jarred M.D.   On: 03/08/2020 20:25   DG Chest Port 1 View  Result Date: 03/08/2020 CLINICAL DATA:  Shortness of breath. EXAM: PORTABLE CHEST 1 VIEW COMPARISON:  CT chest 10/15/2011 FINDINGS: Cardiac enlargement. Decreased lung volumes. Diffuse increase interstitial and airspace opacities within the left upper lobe and left lower lobe are identified compatible with multifocal pneumonia. IMPRESSION: Multifocal interstitial and airspace densities within the left lung compatible with multifocal pneumonia. Electronically Signed   By: Kerby Moors M.D.   On: 03/08/2020 17:37    Scheduled Meds: . enoxaparin (LOVENOX) injection  40 mg Subcutaneous Q24H  . FLUoxetine  20 mg  Oral Daily  . insulin aspart  0-9 Units Subcutaneous TID WC  . levothyroxine  50 mcg Oral Q0600  . lithium carbonate  300 mg Oral Daily  . methylPREDNISolone (SOLU-MEDROL) injection  40 mg Intravenous Q12H   Followed by  . [START ON 03/12/2020] predniSONE  50 mg Oral Daily   Continuous Infusions: . azithromycin Stopped (03/09/20 0011)  . cefTRIAXone (ROCEPHIN)  IV Stopped (03/08/20 2339)     LOS: 1 day   Time spent: 35 minutes.  Patrecia Pour,  MD [facility] [email or web address] 03/09/2020, 2:18 PM

## 2020-03-09 NOTE — ED Notes (Signed)
Report given to [facility]

## 2020-03-09 NOTE — ED Notes (Signed)
Receiving RN unavailable for report. Writer will try again in 10 minutes

## 2020-03-10 LAB — COMPREHENSIVE METABOLIC PANEL
ALT: 33 U/L (ref 0–44)
AST: 21 U/L (ref 15–41)
Albumin: 2.5 g/dL — ABNORMAL LOW (ref 3.5–5.0)
Alkaline Phosphatase: 71 U/L (ref 38–126)
Anion gap: 10 (ref 5–15)
BUN: 31 mg/dL — ABNORMAL HIGH (ref 8–23)
CO2: 22 mmol/L (ref 22–32)
Calcium: 9.3 mg/dL (ref 8.9–10.3)
Chloride: 101 mmol/L (ref 98–111)
Creatinine, Ser: 1.01 mg/dL — ABNORMAL HIGH (ref 0.44–1.00)
GFR, Estimated: 56 mL/min — ABNORMAL LOW (ref >60)
Glucose, Bld: 186 mg/dL — ABNORMAL HIGH (ref 70–99)
Potassium: 4.1 mmol/L (ref 3.5–5.1)
Sodium: 133 mmol/L — ABNORMAL LOW (ref 135–145)
Total Bilirubin: 0.6 mg/dL (ref 0.3–1.2)
Total Protein: 6.5 g/dL (ref 6.5–8.1)

## 2020-03-10 LAB — CBC WITH DIFFERENTIAL/PLATELET
Abs Immature Granulocytes: 0.2 10*3/uL — ABNORMAL HIGH (ref 0.00–0.07)
Basophils Absolute: 0 10*3/uL (ref 0.0–0.1)
Basophils Relative: 0 %
Eosinophils Absolute: 0 10*3/uL (ref 0.0–0.5)
Eosinophils Relative: 0 %
HCT: 34.9 % — ABNORMAL LOW (ref 36.0–46.0)
Hemoglobin: 11.3 g/dL — ABNORMAL LOW (ref 12.0–15.0)
Immature Granulocytes: 1 %
Lymphocytes Relative: 6 %
Lymphs Abs: 1.1 10*3/uL (ref 0.7–4.0)
MCH: 29.8 pg (ref 26.0–34.0)
MCHC: 32.4 g/dL (ref 30.0–36.0)
MCV: 92.1 fL (ref 80.0–100.0)
Monocytes Absolute: 0.6 10*3/uL (ref 0.1–1.0)
Monocytes Relative: 3 %
Neutro Abs: 18.4 10*3/uL — ABNORMAL HIGH (ref 1.7–7.7)
Neutrophils Relative %: 90 %
Platelets: 288 10*3/uL (ref 150–400)
RBC: 3.79 MIL/uL — ABNORMAL LOW (ref 3.87–5.11)
RDW: 13.6 % (ref 11.5–15.5)
WBC: 20.3 10*3/uL — ABNORMAL HIGH (ref 4.0–10.5)
nRBC: 0 % (ref 0.0–0.2)

## 2020-03-10 LAB — D-DIMER, QUANTITATIVE: D-Dimer, Quant: 3 ug/mL-FEU — ABNORMAL HIGH (ref 0.00–0.50)

## 2020-03-10 LAB — C-REACTIVE PROTEIN: CRP: 13.4 mg/dL — ABNORMAL HIGH (ref <1.0)

## 2020-03-10 LAB — GLUCOSE, CAPILLARY: Glucose-Capillary: 162 mg/dL — ABNORMAL HIGH (ref 70–99)

## 2020-03-10 NOTE — Progress Notes (Addendum)
PROGRESS NOTE  Laura Drake  FXT:[id number] DOB: Oct 07, 1940 DOA: 03/08/2020 PCP: Merryl Hacker, No   Brief Narrative: Laura Drake is an 79 y.o. female with a history of covid-19 pneumonia diagnosed 11/14 when she declined admission, discharged on 4-5L O2 who returned for care 12/2 with worsening shortness of breath found to have worsened hypoxia requiring 10L HFNC. In the ED CXR and subsequent CTA chest revealed multifocal peripheral-predominant opacities with no evidence of PE. CRP was 31 and PCT 0.50, WBC 16.4k (had been on steroids). D-dimer 4.76. Due to suspicion for superimposed bacterial pneumonia, cefepime and vancomycin were given in ED. Steroids also started for covid pneumonia. Remdesivir declined by family and not felt to confer much benefit, not started.   Assessment & Plan: Principal Problem:   Acute respiratory distress syndrome (ARDS) due to COVID-19 virus [facility]) Active Problems:   Bipolar affective disorder (HCC)   Bacterial pneumonia   Acute respiratory failure with hypoxia (HCC)   Hyponatremia   Dementia without behavioral disturbance (HCC)  Acute hypoxemic respiratory failure due to covid-19 pneumonia with superimposed bacterial PNA: Patient has supplied photocopy of positive Ag testing from Feb 11, 2020 (in physical chart). Therefore isolation period concluded after 11/27. Symptoms began 11/1, not vaccinated, has been hypoxic since 11/14 with worsening 12/2. - DC isolation  - Continue steroids. CRP responding. - Continue ceftriaxone, azithromycin. With elevated PCT and severely elevated CRP, bacterial pneumonia is the most likely cause of worsening >2 weeks after covid diagnosis. Since infiltrates are multifocal, MRSA is considered, though she doesn't have significant risk factors for this. Check PCR screen for hopeful r/o, consider restarting coverage if positive and/or clinically worsening.  - Sputum culture, blood cultures, urine legionella pending. Strep Ag neg. -  Immunomodulators not started due to concern for bacterial pneumonia. Could consider baricitinib now that she's requiring HFNC with severe CRP elevation.  - Encourage OOB, IS, FV, and awake proning if able - Continue airborne, contact precautions for 21 days from positive testing. - Monitor CMP and inflammatory markers - Enoxaparin prophylactic dose.   Elevated d-dimer: No PE on CTA, no DVT on LE U/S - Keep on ppx lovenox  Bipolar disorder:  - Continue lithium, SSRI - Pt is competent to make medical decisions at the time of my interview, though her mentation seems to wax and wane. 1 visitor has been allowed to assist with this.   Prediabetes, steroid-induced hyperglycemia: HbA1c 6.3%.  - SSI recommended, but declined consistently by patient. CBG checks discontinued.  Hypothyroidism:  - Continue synthroid.    Hyponatremia: Improving.  - Stopped IVF for restrictive fluid strategy with covid patient.  Obesity: Estimated body mass index is 32.15 kg/m as calculated from the following:   Height as of this encounter: 5' 2.99" (1.6 m).   Weight as of this encounter: 82.3 kg.  DVT prophylaxis: Lovenox 40mg  q24h Code Status: DNR Family Communication: Daughter at bedside. Disposition Plan:  Status is: Inpatient  Remains inpatient appropriate because:Inpatient level of care appropriate due to severity of illness  Dispo: The patient is from: Home              Anticipated d/c is to: TBD based on clinical progress              Anticipated d/c date is: 3 days              Patient currently is not medically stable to d/c.  Consultants:   None  Procedures:   None  Antimicrobials:  Remdesivir x1  12/2  Vancomycin, cefepime x1 12/2  Ceftriaxone, azithromycin 12/2 >>   Subjective: Feels a bit stronger, mentation is clearer per daughter at bedside. Did not sleep much at all, annoyed by HEPA filter sounds. No chest pain reported. Shortness of breath at rest is minimal, hasn't exerted  herself today but has severe dyspnea when doing so. Pt remembers me and many details of our conversation yesterday.  Objective: Vitals:   03/09/20 2009 03/10/20 0004 03/10/20 0433 03/10/20 0743  BP: 119/73 130/72 130/67 131/70  Pulse: 84 80 74 72  Resp: 18 18  (!) 21  Temp: 97.6 F (36.4 C) 97.8 F (36.6 C) (!) 97.5 F (36.4 C) 97.6 F (36.4 C)  TempSrc: Oral Oral Oral   SpO2: 92% 91% 92% 91%  Weight:      Height:        Intake/Output Summary (Last 24 hours) at 03/10/2020 1151 Last data filed at 03/10/2020 0557 Gross per 24 hour  Intake 300 ml  Output 300 ml  Net 0 ml   Filed Weights   03/08/20 2200 03/09/20 1835  Weight: 82.3 kg 82.3 kg   Gen: Elderly female in no distress Pulm: Nonlabored breathing 10L HFNC, crackles diffusely without wheezes. CV: Regular rate and rhythm. No murmur, rub, or gallop. No JVD, no dependent edema. GI: Abdomen soft, non-tender, non-distended, with normoactive bowel sounds.  Ext: Warm, no deformities Skin: No rashes, lesions or ulcers on visualized skin. Neuro: Alert and oriented. No focal neurological deficits. Psych: Judgement and insight appear fair. Mood euthymic & affect congruent. Behavior is appropriate.    Data Reviewed: I have personally reviewed following labs and imaging studies  CBC: Recent Labs  Lab 03/08/20 1630 03/08/20 1646 03/09/20 0630 03/10/20 0601  WBC 16.4*  --  12.2* 20.3*  NEUTROABS 13.5*  --  11.3* 18.4*  HGB 12.7 14.3 12.9 11.3*  HCT 38.7 42.0 40.4 34.9*  MCV 92.4  --  92.9 92.1  PLT 224  --  253 287   Basic Metabolic Panel: Recent Labs  Lab 03/08/20 1630 03/08/20 1646 03/09/20 0630 03/10/20 0601  NA 128* 130* 133* 133*  K 4.0 4.2 4.6 4.1  CL 94* 96* 101 101  CO2 23  --  22 22  GLUCOSE 136* 140* 205* 186*  BUN 16 16 19  31*  CREATININE 0.90 0.80 0.93 1.01*  CALCIUM 9.3  --  9.3 9.3   GFR: Estimated Creatinine Clearance: 44.4 mL/min (A) (by C-G formula based on SCr of 1.01 mg/dL (H)). Liver  Function Tests: Recent Labs  Lab 03/08/20 1630 03/09/20 0630 03/10/20 0601  AST 32 23 21  ALT 48* 41 33  ALKPHOS 78 72 71  BILITOT 1.0 0.4 0.6  PROT 7.2 6.7 6.5  ALBUMIN 2.8* 2.6* 2.5*   No results for input(s): LIPASE, AMYLASE in the last 168 hours. No results for input(s): AMMONIA in the last 168 hours. Coagulation Profile: No results for input(s): INR, PROTIME in the last 168 hours. Cardiac Enzymes: No results for input(s): CKTOTAL, CKMB, CKMBINDEX, TROPONINI in the last 168 hours. BNP (last 3 results) No results for input(s): PROBNP in the last 8760 hours. HbA1C: Recent Labs    03/08/20 2315  HGBA1C 6.3*   CBG: Recent Labs  Lab 03/08/20 2314 03/09/20 0836 03/09/20 1749 03/09/20 2052 03/10/20 0739  GLUCAP 209* 168* 241* 218* 162*   Lipid Profile: Recent Labs    03/08/20 1630  TRIG 70   Thyroid Function Tests: No results for input(s): TSH, T4TOTAL, FREET4,  T3FREE, THYROIDAB in the last 72 hours. Anemia Panel: Recent Labs    03/08/20 1630  FERRITIN 909*   Urine analysis:    Component Value Date/Time   COLORURINE YELLOW 10/14/2011 2221   APPEARANCEUR TURBID (A) 10/14/2011 2221   LABSPEC >1.046 (H) 10/14/2011 2221   PHURINE 6.0 10/14/2011 2221   GLUCOSEU NEGATIVE 10/14/2011 2221   HGBUR NEGATIVE 10/14/2011 2221   BILIRUBINUR Neg 12/11/2011 1044   KETONESUR NEGATIVE 10/14/2011 2221   PROTEINUR Neg 12/11/2011 1044   PROTEINUR NEGATIVE 10/14/2011 2221   UROBILINOGEN 0.2 12/11/2011 1044   UROBILINOGEN 1.0 10/14/2011 2221   NITRITE Neg 12/11/2011 1044   NITRITE NEGATIVE 10/14/2011 2221   LEUKOCYTESUR moderate (2+) 12/11/2011 1044   Recent Results (from the past 240 hour(s))  Resp Panel by RT-PCR (Flu A&B, Covid) Nasopharyngeal Swab     Status: Abnormal   Collection Time: 03/08/20  4:14 PM   Specimen: Nasopharyngeal Swab; Nasopharyngeal(NP) swabs in vial transport medium  Result Value Ref Range Status   SARS Coronavirus 2 by RT PCR POSITIVE (A)  NEGATIVE Final    Comment: RESULT CALLED TO, READ BACK BY AND VERIFIED WITH: DR. Darl Householder AT 7209 ON 03/08/20 BY N.[email or web address] (NOTE) SARS-CoV-2 target nucleic acids are DETECTED.  The SARS-CoV-2 RNA is generally detectable in upper respiratory specimens during the acute phase of infection. Positive results are indicative of the presence of the identified virus, but do not rule out bacterial infection or co-infection with other pathogens not detected by the test. Clinical correlation with patient history and other diagnostic information is necessary to determine patient infection status. The expected result is Negative.  Fact Sheet for Patients: EntrepreneurPulse.com.au  Fact Sheet for Healthcare Providers: IncredibleEmployment.be  This test is not yet approved or cleared by the [place] FDA and  has been authorized for detection and/or diagnosis of SARS-CoV-2 by FDA under an Emergency Use Authorization (EUA).  This EUA will remain in effect (meaning this test  can be used) for the duration of  the COVID-19 declaration under Section 564(b)(1) of the Act, 21 U.S.C. section 360bbb-3(b)(1), unless the authorization is terminated or revoked sooner.     Influenza A by PCR NEGATIVE NEGATIVE Final   Influenza B by PCR NEGATIVE NEGATIVE Final    Comment: (NOTE) The Xpert Xpress SARS-CoV-2/FLU/RSV plus assay is intended as an aid in the diagnosis of influenza from Nasopharyngeal swab specimens and should not be used as a sole basis for treatment. Nasal washings and aspirates are unacceptable for Xpert Xpress SARS-CoV-2/FLU/RSV testing.  Fact Sheet for Patients: EntrepreneurPulse.com.au  Fact Sheet for Healthcare Providers: IncredibleEmployment.be  This test is not yet approved or cleared by the [place] FDA and has been authorized for detection and/or diagnosis of SARS-CoV-2 by FDA under an Emergency Use  Authorization (EUA). This EUA will remain in effect (meaning this test can be used) for the duration of the COVID-19 declaration under Section 564(b)(1) of the Act, 21 U.S.C. section 360bbb-3(b)(1), unless the authorization is terminated or revoked.  Performed at [facility], [place] [address] [place], [place] [address]   Blood Culture (routine x 2)     Status: None (Preliminary result)   Collection Time: 03/08/20  4:20 PM   Specimen: BLOOD  Result Value Ref Range Status   Specimen Description   Final    BLOOD RIGHT ANTECUBITAL Performed at [place] [address], [place], [place] [address]    Special Requests   Final    BOTTLES DRAWN  AEROBIC AND ANAEROBIC Blood Culture results may not be optimal due to an inadequate volume of blood received in culture bottles Performed at [facility], [place] [address], [place], [place] [address]    Culture   Final    NO GROWTH 2 DAYS Performed at [place] [address], [place], [place] [address]    Report Status PENDING  Incomplete  Blood Culture (routine x 2)     Status: None (Preliminary result)   Collection Time: 03/08/20  4:30 PM   Specimen: BLOOD  Result Value Ref Range Status   Specimen Description   Final    BLOOD RIGHT WRIST Performed at [place] [address] [place], [place] [address]    Special Requests   Final    BOTTLES DRAWN AEROBIC AND ANAEROBIC Blood Culture adequate volume Performed at [place] [address], [place], [place] [address]    Culture   Final    NO GROWTH 2 DAYS Performed at [place] [address] [place], [place] [address]    Report Status PENDING  Incomplete      Radiology Studies: CT Angio Chest PE W and/or Wo Contrast  Result Date: 03/08/2020 CLINICAL DATA:  Shortness of breath EXAM: CT ANGIOGRAPHY CHEST WITH CONTRAST TECHNIQUE: Multidetector CT imaging of the chest  was performed using the standard protocol during bolus administration of intravenous contrast. Multiplanar CT image reconstructions and MIPs were obtained to evaluate the vascular anatomy. CONTRAST:  11mL OMNIPAQUE IOHEXOL 350 MG/ML SOLN COMPARISON:  10/14/2011 FINDINGS: Cardiovascular: Contrast injection is sufficient to demonstrate satisfactory opacification of the pulmonary arteries to the segmental level. There is no pulmonary embolus or evidence of right heart strain. The size of the main pulmonary artery is normal. Heart size is normal, with no pericardial effusion. The course and caliber of the aorta are normal. There is mild atherosclerotic calcification. Opacification decreased due to pulmonary arterial phase contrast bolus timing. Mediastinum/Nodes: No mediastinal, hilar or axillary lymphadenopathy. Normal visualized thyroid. Lungs/Pleura: Multifocal, peripheral predominant ground glass opacities. No pleural effusion. Upper Abdomen: Contrast bolus timing is not optimized for evaluation of the abdominal organs. The visualized portions of the organs of the upper abdomen are normal. Musculoskeletal: No chest wall abnormality. No bony spinal canal stenosis. Review of the MIP images confirms the above findings. IMPRESSION: 1. No pulmonary embolus or acute aortic syndrome. 2. Multifocal, peripheral predominant ground glass opacities, most consistent with multifocal pneumonia. Aortic Atherosclerosis (ICD10-I70.0). Electronically Signed   By: Ulyses Jarred M.D.   On: 03/08/2020 20:25   DG Chest Port 1 View  Result Date: 03/08/2020 CLINICAL DATA:  Shortness of breath. EXAM: PORTABLE CHEST 1 VIEW COMPARISON:  CT chest 10/15/2011 FINDINGS: Cardiac enlargement. Decreased lung volumes. Diffuse increase interstitial and airspace opacities within the left upper lobe and left lower lobe are identified compatible with multifocal pneumonia. IMPRESSION: Multifocal interstitial and airspace densities within the left lung  compatible with multifocal pneumonia. Electronically Signed   By: Kerby Moors M.D.   On: 03/08/2020 17:37   VAS [place] LOWER EXTREMITY VENOUS (DVT)  Result Date: 03/09/2020  Lower Venous DVT Study Indications: Elevated ddimer.  Comparison Study: no prior Performing Technologist: Abram Sander RVS  Examination Guidelines: A complete evaluation includes B-mode imaging, spectral Doppler, color Doppler, and power Doppler as needed of all accessible portions of each vessel. Bilateral testing is considered an integral part of a complete examination. Limited examinations for reoccurring indications may be performed  as noted. The reflux portion of the exam is performed with the patient in reverse Trendelenburg.  +---------+---------------+---------+-----------+----------+--------------+ RIGHT    CompressibilityPhasicitySpontaneityPropertiesThrombus Aging +---------+---------------+---------+-----------+----------+--------------+ CFV      Full           Yes      Yes                                 +---------+---------------+---------+-----------+----------+--------------+ SFJ      Full                                                        +---------+---------------+---------+-----------+----------+--------------+ FV Prox  Full                                                        +---------+---------------+---------+-----------+----------+--------------+ FV Mid   Full                                                        +---------+---------------+---------+-----------+----------+--------------+ FV DistalFull                                                        +---------+---------------+---------+-----------+----------+--------------+ PFV      Full                                                        +---------+---------------+---------+-----------+----------+--------------+ POP      Full           Yes      Yes                                  +---------+---------------+---------+-----------+----------+--------------+ PTV      Full                                                        +---------+---------------+---------+-----------+----------+--------------+ PERO     Full                                                        +---------+---------------+---------+-----------+----------+--------------+   +---------+---------------+---------+-----------+----------+--------------+ LEFT     CompressibilityPhasicitySpontaneityPropertiesThrombus Aging +---------+---------------+---------+-----------+----------+--------------+ CFV      Full           Yes  Yes                                 +---------+---------------+---------+-----------+----------+--------------+ SFJ      Full                                                        +---------+---------------+---------+-----------+----------+--------------+ FV Prox  Full                                                        +---------+---------------+---------+-----------+----------+--------------+ FV Mid   Full                                                        +---------+---------------+---------+-----------+----------+--------------+ FV DistalFull                                                        +---------+---------------+---------+-----------+----------+--------------+ PFV      Full                                                        +---------+---------------+---------+-----------+----------+--------------+ POP      Full           Yes      Yes                                 +---------+---------------+---------+-----------+----------+--------------+ PTV      Full                                                        +---------+---------------+---------+-----------+----------+--------------+ PERO     Full                                                         +---------+---------------+---------+-----------+----------+--------------+     Summary: BILATERAL: - No evidence of deep vein thrombosis seen in the lower extremities, bilaterally. - No evidence of superficial venous thrombosis in the lower extremities, bilaterally. -No evidence of popliteal cyst, bilaterally.   *See table(s) above for measurements and observations.    Preliminary     Scheduled Meds: . enoxaparin (LOVENOX) injection  40 mg Subcutaneous Q24H  . FLUoxetine  20 mg Oral Daily  . levothyroxine  50  mcg Oral Q0600  . lithium carbonate  300 mg Oral Daily  . methylPREDNISolone (SOLU-MEDROL) injection  40 mg Intravenous Q12H   Followed by  . [START ON 03/12/2020] predniSONE  50 mg Oral Daily   Continuous Infusions: . azithromycin 500 mg (03/09/20 2216)  . cefTRIAXone (ROCEPHIN)  IV 2 g (03/09/20 2349)     LOS: 2 days   Time spent: 35 minutes.  Patrecia Pour, MD [facility] [email or web address] 03/10/2020, 11:51 AM

## 2020-03-10 NOTE — Plan of Care (Signed)

## 2020-03-11 DIAGNOSIS — J189 Pneumonia, unspecified organism: Secondary | ICD-10-CM

## 2020-03-11 LAB — COMPREHENSIVE METABOLIC PANEL
ALT: 26 U/L (ref 0–44)
AST: 19 U/L (ref 15–41)
Albumin: 2.4 g/dL — ABNORMAL LOW (ref 3.5–5.0)
Alkaline Phosphatase: 61 U/L (ref 38–126)
Anion gap: 9 (ref 5–15)
BUN: 30 mg/dL — ABNORMAL HIGH (ref 8–23)
CO2: 21 mmol/L — ABNORMAL LOW (ref 22–32)
Calcium: 9.1 mg/dL (ref 8.9–10.3)
Chloride: 106 mmol/L (ref 98–111)
Creatinine, Ser: 0.9 mg/dL (ref 0.44–1.00)
GFR, Estimated: >60 mL/min (ref >60)
Glucose, Bld: 112 mg/dL — ABNORMAL HIGH (ref 70–99)
Potassium: 4.5 mmol/L (ref 3.5–5.1)
Sodium: 136 mmol/L (ref 135–145)
Total Bilirubin: 0.4 mg/dL (ref 0.3–1.2)
Total Protein: 6 g/dL — ABNORMAL LOW (ref 6.5–8.1)

## 2020-03-11 LAB — CBC WITH DIFFERENTIAL/PLATELET
Abs Immature Granulocytes: 0.1 10*3/uL — ABNORMAL HIGH (ref 0.00–0.07)
Basophils Absolute: 0 10*3/uL (ref 0.0–0.1)
Basophils Relative: 0 %
Eosinophils Absolute: 0 10*3/uL (ref 0.0–0.5)
Eosinophils Relative: 0 %
HCT: 36.4 % (ref 36.0–46.0)
Hemoglobin: 11.7 g/dL — ABNORMAL LOW (ref 12.0–15.0)
Immature Granulocytes: 1 %
Lymphocytes Relative: 10 %
Lymphs Abs: 1.3 10*3/uL (ref 0.7–4.0)
MCH: 30.2 pg (ref 26.0–34.0)
MCHC: 32.1 g/dL (ref 30.0–36.0)
MCV: 93.8 fL (ref 80.0–100.0)
Monocytes Absolute: 0.7 10*3/uL (ref 0.1–1.0)
Monocytes Relative: 5 %
Neutro Abs: 10.9 10*3/uL — ABNORMAL HIGH (ref 1.7–7.7)
Neutrophils Relative %: 84 %
Platelets: 294 10*3/uL (ref 150–400)
RBC: 3.88 MIL/uL (ref 3.87–5.11)
RDW: 13.9 % (ref 11.5–15.5)
WBC: 12.9 10*3/uL — ABNORMAL HIGH (ref 4.0–10.5)
nRBC: 0 % (ref 0.0–0.2)

## 2020-03-11 LAB — PROCALCITONIN: Procalcitonin: 0.26 ng/mL

## 2020-03-11 LAB — C-REACTIVE PROTEIN: CRP: 6.2 mg/dL — ABNORMAL HIGH (ref <1.0)

## 2020-03-11 LAB — MRSA PCR SCREENING: MRSA by PCR: NEGATIVE

## 2020-03-11 LAB — D-DIMER, QUANTITATIVE: D-Dimer, Quant: 2.68 ug/mL-FEU — ABNORMAL HIGH (ref 0.00–0.50)

## 2020-03-11 MED ORDER — AZITHROMYCIN 250 MG PO TABS
500.0000 mg | ORAL_TABLET | Freq: Every day | ORAL | Status: AC
  Administered 2020-03-11 – 2020-03-12 (×2): 500 mg via ORAL
  Filled 2020-03-11 (×2): qty 2

## 2020-03-11 NOTE — Progress Notes (Signed)
PHARMACIST - PHYSICIAN COMMUNICATION  CONCERNING: Antibiotic IV to Oral Route Change Policy  RECOMMENDATION: This patient is receiving azithromycin by the intravenous route.  Based on criteria approved by the Pharmacy and Therapeutics Committee, the antibiotic(s) is/are being converted to the equivalent oral dose form(s).   DESCRIPTION: These criteria include:  Patient being treated for a respiratory tract infection, urinary tract infection, cellulitis or clostridium difficile associated diarrhea if on metronidazole  The patient is not neutropenic and does not exhibit a GI malabsorption state  The patient is eating (either orally or via tube) and/or has been taking other orally administered medications for a least 24 hours  The patient is improving clinically and has a Tmax < 100.5  If you have questions about this conversion, please contact the Pharmacy Department  []  ( [phone] )  [place] []  ( [phone] )  [facility] []  ( [phone]  [place] []  ( [phone] )  [facility] [x]  ( [phone] )  [facility]  

## 2020-03-11 NOTE — Evaluation (Signed)
Occupational Therapy Evaluation Patient Details Name: Laura Drake MRN: [id number] DOB: 05-14-1940 Today's Date: 03/11/2020    History of Present Illness Laura Drake is an 79 y.o. female with a history of covid-19 pneumonia diagnosed 11/14 when she declined admission, discharged on 4-5L O2 who returned for care 12/2 with worsening shortness of breath found to have worsened hypoxia requiring 10L HFNC. In the ED CXR and subsequent CTA chest revealed multifocal peripheral-predominant opacities with no evidence of PE.   Clinical Impression   Ms. [name] is an 79 year old woman who presents with generalized weakness, decreased activity tolerance and cardiopulmonary status currently requiring 4 LNC, and impaired balance. On evaluation patient min assist for bed mobility, min guard for standing, set up to min assist for UB and LB ADLs, and min-mod assist for toileting (patient reports sometimes she needs more help due to shortness of breath). With transfer into sitting patient's o2 sat dropped to 84% and with standing and taking steps patient's o2 sat dropped to 79%. Patient dyspneic but sat recovers quickly. Patient will benefit from skilled OT services while in hospital to improve deficits and learn compensatory strategies as needed in order to return PLOF. Patient would benefit from short term rehab at discharge due to deficits but patient states she does not want to go to SNF. Son in room reports he will try to line up 24/7 assistance.     Follow Up Recommendations  Home health OT;Supervision/Assistance - 24 hour;SNF    Equipment Recommendations  3 in 1 bedside commode    Recommendations for Other Services       Precautions / Restrictions Precautions Precaution Comments: monitor vitals Restrictions Weight Bearing Restrictions: No      Mobility Bed Mobility Overal bed mobility: Needs Assistance Bed Mobility: Supine to Sit;Sit to Supine     Supine to sit: Min assist;HOB  elevated Sit to supine: Min assist   General bed mobility comments: Min assist for hand hold to cone into sitting. Min assist for LEs to transfer back into bed.    Transfers Overall transfer level: Needs assistance Equipment used: None Transfers: Stand Pivot Transfers;Sit to/from Stand Sit to Stand: Min guard Stand pivot transfers: Min guard       General transfer comment: Patient performed sit to stand and took steps to head of the bed. O2  sat dropped to 79% but recoverd quickly. With minimal activity patient drops to mid 80s.    Balance Overall balance assessment: Needs assistance Sitting-balance support: No upper extremity supported;Feet supported Sitting balance-Leahy Scale: Good     Standing balance support: During functional activity;Single extremity supported Standing balance-Leahy Scale: Fair                             ADL either performed or assessed with clinical judgement   ADL Overall ADL's : Needs assistance/impaired Eating/Feeding: Independent   Grooming: Set up   Upper Body Bathing: Set up   Lower Body Bathing: Minimal assistance;Sit to/from stand   Upper Body Dressing : Set up;Sitting   Lower Body Dressing: Minimal assistance;Set up Lower Body Dressing Details (indicate cue type and reason): Min assist to don socks as patient predominantly performed task but needed assistance when she became short of breath. Toilet Transfer: Min Actuary and Hygiene: Moderate assistance;Min guard               Vision Patient Visual Report: No change from baseline  Perception     Praxis      Pertinent Vitals/Pain Pain Assessment: No/denies pain     Hand Dominance     Extremity/Trunk Assessment Upper Extremity Assessment Upper Extremity Assessment: Overall WFL for tasks assessed   Lower Extremity Assessment Lower Extremity Assessment: Defer to PT evaluation   Cervical / Trunk  Assessment Cervical / Trunk Assessment: Kyphotic   Communication Communication Communication: No difficulties   Cognition Arousal/Alertness: Awake/alert Behavior During Therapy: WFL for tasks assessed/performed Overall Cognitive Status: Within Functional Limits for tasks assessed                                     General Comments       Exercises     Shoulder Instructions      Home Living Family/patient expects to be discharged to:: Private residence Living Arrangements: Children;Other relatives Available Help at Discharge: Family Type of Home: House Home Access: Stairs to enter [employer] of Steps: 2   Home Layout: One level     Bathroom Shower/Tub: Tub/shower unit         Home Equipment: Environmental consultant - 4 wheels;Shower seat   Additional Comments: Patient's son works in [place] 1/2 the week. He reports he can take some time off and is going to try to line up 24/7 care as patient doesn't want to go to rehab.      Prior Functioning/Environment Level of Independence: Independent with assistive device(s)                 OT Problem List: Decreased strength;Decreased activity tolerance;Impaired balance (sitting and/or standing);Cardiopulmonary status limiting activity;Decreased knowledge of use of DME or AE      OT Treatment/Interventions: Self-care/ADL training;Therapeutic exercise;DME and/or AE instruction;Therapeutic activities;Patient/family education;Balance training    OT Goals(Current goals can be found in the care plan section) Acute Rehab OT Goals Patient Stated Goal: To improve independence OT Goal Formulation: With patient/family Time For Goal Achievement: 03/25/20 Potential to Achieve Goals: Good  OT Frequency: Min 2X/week   Barriers to D/C:            Co-evaluation              AM-PAC OT "6 Clicks" Daily Activity     Outcome Measure Help from another person eating meals?: A Little Help from another person taking care  of personal grooming?: A Little Help from another person toileting, which includes using toliet, bedpan, or urinal?: A Lot Help from another person bathing (including washing, rinsing, drying)?: A Little Help from another person to put on and taking off regular upper body clothing?: A Little Help from another person to put on and taking off regular lower body clothing?: A Little 6 Click Score: 17   End of Session Nurse Communication: Mobility status  Activity Tolerance: Patient tolerated treatment well Patient left: in bed;with call bell/phone within reach;with family/visitor present  OT Visit Diagnosis: Unsteadiness on feet (R26.81);Muscle weakness (generalized) (M62.81)                Time: 9169-4503 OT Time Calculation (min): 24 min Charges:  OT General Charges $OT Visit: 1 Visit OT Evaluation $OT Eval Moderate Complexity: 1 Mod  Henryk Ursin, OTR/L [place]  Office [phone] Pager: [place] 03/11/2020, 4:17 PM

## 2020-03-11 NOTE — Evaluation (Signed)
Physical Therapy Evaluation Patient Details Name: Laura Drake MRN: [id number] DOB: [birth year] Today's Date: 03/11/2020   History of Present Illness  Laura Drake is an 79 y.o. female with a history of covid-19 pneumonia diagnosed 11/14 when she declined admission, discharged on 4-5L O2 who returned for care 12/2 with worsening shortness of breath found to have worsened hypoxia requiring 10L HFNC. In the ED CXR and subsequent CTA chest revealed multifocal peripheral-predominant opacities with no evidence of PE.  Clinical Impression  Pt admitted with above diagnosis.  Pt currently with functional limitations due to the deficits listed below (see PT Problem List). Pt will benefit from skilled PT to increase their independence and safety with mobility to allow discharge to the venue listed below.  Pt de-sat into mid 76% on 4 L/min with SPT to [facility]. BSC> recliner transfer on 6 L/min with o2 sat in min 80s. Once in recliner increased and was on 4 L/min at 92%.  Pt would benefit from HHPT.     Follow Up Recommendations Home health PT;Supervision/Assistance - 24 hour    Equipment Recommendations  None recommended by PT    Recommendations for Other Services       Precautions / Restrictions Precautions Precaution Comments: monitor vitals Restrictions Weight Bearing Restrictions: No      Mobility  Bed Mobility Overal bed mobility: Needs Assistance Bed Mobility: Supine to Sit     Supine to sit: Supervision;HOB elevated     General bed mobility comments: Pt able to come to EOB with S only and HOB elevated with rail    Transfers Overall transfer level: Needs assistance   Transfers: Stand Pivot Transfers   Stand pivot transfers: Min assist       General transfer comment: SPT with MIN A with o2 dropping to 76-81% on 4 L/min. Used HHA bed > BSC > recliner  Ambulation/Gait             General Gait Details: deferred due to de-sat with transfers.  Stairs             Wheelchair Mobility    Modified Rankin (Stroke Patients Only)       Balance Overall balance assessment: Needs assistance Sitting-balance support: Feet supported Sitting balance-Leahy Scale: Fair       Standing balance-Leahy Scale: Fair                               Pertinent Vitals/Pain Pain Assessment: No/denies pain    Home Living Family/patient expects to be discharged to:: Private residence Living Arrangements: Children;Other relatives             Home Equipment: Walker - 2 wheels Additional Comments: Pt poor historian, but states she lives with son but he is gone every 2 weeks?    Prior Function           Comments: Sounds like pt ambulates with RW.     Hand Dominance        Extremity/Trunk Assessment   Upper Extremity Assessment Upper Extremity Assessment: Defer to OT evaluation    Lower Extremity Assessment Lower Extremity Assessment: Generalized weakness       Communication      Cognition Arousal/Alertness: Awake/alert Behavior During Therapy: WFL for tasks assessed/performed Overall Cognitive Status: No family/caregiver present to determine baseline cognitive functioning Area of Impairment: Memory;Following commands;Safety/judgement;Problem solving  Memory: Decreased short-term memory Following Commands: Follows one step commands with increased time;Follows one step commands inconsistently Safety/Judgement: Decreased awareness of deficits;Decreased awareness of safety   Problem Solving: Slow processing;Requires verbal cues;Requires tactile cues        General Comments General comments (skin integrity, edema, etc.): o2 dropped into 70s on 4L/min increased to 6 L for transfer [facility] >recliner and running in low 80s. Once in recliner, o2 slowly returned to low 90s.  O2 returned to 4 L/min and was 92% at time of exit.    Exercises     Assessment/Plan    PT Assessment Patient needs continued PT  services  PT Problem List Decreased strength;Decreased activity tolerance;Decreased balance;Decreased mobility;Decreased safety awareness       PT Treatment Interventions Gait training;DME instruction;Functional mobility training;Therapeutic activities;Therapeutic exercise    PT Goals (Current goals can be found in the Care Plan section)  Acute Rehab PT Goals Patient Stated Goal: Pt agreeable to PT PT Goal Formulation: With patient Time For Goal Achievement: 03/25/20 Potential to Achieve Goals: Good    Frequency Min 3X/week   Barriers to discharge        Co-evaluation               AM-PAC PT "6 Clicks" Mobility  Outcome Measure Help needed turning from your back to your side while in a flat bed without using bedrails?: A Little Help needed moving from lying on your back to sitting on the side of a flat bed without using bedrails?: A Little Help needed moving to and from a bed to a chair (including a wheelchair)?: A Little Help needed standing up from a chair using your arms (e.g., wheelchair or bedside chair)?: A Little Help needed to walk in hospital room?: A Little Help needed climbing 3-5 steps with a railing? : A Lot 6 Click Score: 17    End of Session Equipment Utilized During Treatment: Gait belt;Oxygen Activity Tolerance: Patient limited by fatigue Patient left: in chair;with call bell/phone within reach;with chair alarm set Nurse Communication: Mobility status PT Visit Diagnosis: Difficulty in walking, not elsewhere classified (R26.2);Muscle weakness (generalized) (M62.81)    Time: 1683-7290 PT Time Calculation (min) (ACUTE ONLY): 33 min   Charges:   PT Evaluation $PT Eval Moderate Complexity: 1 Mod PT Treatments $Therapeutic Activity: 8-22 mins        Rand Boller L. Tamala Julian, [place] Pager [phone] 03/11/2020   Galen Manila 03/11/2020, 10:41 AM

## 2020-03-11 NOTE — Progress Notes (Signed)
[facility] PROGRESS NOTE    Progress Note  Laura Drake  XMI:[id number] DOB: 09/11/1940 DOA: 03/08/2020 PCP: Merryl Hacker, No     Brief Narrative:   Laura Drake is an 79 y.o. female recently discharged from the hospital on 4 L in November due to COVID-19 returns on 05/10/2019 hypoxic requiring 2 L of high flow nasal cannula CT angio revealed bilateral diffuse infiltrates with no evidence of PE CRP was 31 white count was 16 (she had been on steroids) D-dimer 4.7 due to suspicious to superimposed pneumonia she was started on IV Vanco and cefepime, her family declined remdesivir.  Assessment/Plan:   Acute respiratory distress syndrome (ARDS) due to COVID-19 virus [facility]) Her isolation completed on 03/03/2020.  She was readmitted in March 08, 2020 On admission she was started empirically on IV Rocephin and azithromycin, her CRP on admission was 13. Check a procalcitonin.  D-dimer seems to be improving. Culture data has remained negative till date. Immunomodulators not started due to concerns of bacterial pneumonia. Encourage out of bed to chair, incentive spirometry and pulmonary for at least 16 hours a day. She is currently on DVT prophylaxis.  Elevated D-dimer: No PE and CT angio lower extremity Doppler was negative for DVT. Continue Lovenox prophylactic dose.  Bipolar disorder: Continue lithium and SSRI.  Steroid-induced hyperglycemia: With an A1c of 6.3, currently on sliding scale, but she consistently declined CBGs and insulin so it was discontinued.  Diuretics on: Continue Synthroid.  Mild hyponatremia: Noted.   DVT prophylaxis: lovenxo Family Communication:none Status is: Inpatient  Remains inpatient appropriate because:Hemodynamically unstable   Dispo: The patient is from: SNF              Anticipated d/c is to: SNF              Anticipated d/c date is: > 3 days              Patient currently is not medically stable to d/c.        Code Status:       Code Status Orders  (From admission, onward)         Start     Ordered   03/08/20 2145  Do not attempt resuscitation (DNR)  Continuous       Question Answer Comment  In the event of cardiac or respiratory ARREST Do not call a "code blue"   In the event of cardiac or respiratory ARREST Do not perform Intubation, CPR, defibrillation or ACLS   In the event of cardiac or respiratory ARREST Use medication by any route, position, wound care, and other measures to relive pain and suffering. May use oxygen, suction and manual treatment of airway obstruction as needed for comfort.      03/08/20 2150        Code Status History    This patient has a current code status but no historical code status.   Advance Care Planning Activity        IV Access:    Peripheral IV   Procedures and diagnostic studies:   VAS [place] LOWER EXTREMITY VENOUS (DVT)  Result Date: 03/10/2020  Lower Venous DVT Study Indications: Elevated ddimer.  Comparison Study: no prior Performing Technologist: Abram Sander RVS  Examination Guidelines: A complete evaluation includes B-mode imaging, spectral Doppler, color Doppler, and power Doppler as needed of all accessible portions of each vessel. Bilateral testing is considered an integral part of a complete examination. Limited examinations for reoccurring indications may be performed as  noted. The reflux portion of the exam is performed with the patient in reverse Trendelenburg.  +---------+---------------+---------+-----------+----------+--------------+ RIGHT    CompressibilityPhasicitySpontaneityPropertiesThrombus Aging +---------+---------------+---------+-----------+----------+--------------+ CFV      Full           Yes      Yes                                 +---------+---------------+---------+-----------+----------+--------------+ SFJ      Full                                                         +---------+---------------+---------+-----------+----------+--------------+ FV Prox  Full                                                        +---------+---------------+---------+-----------+----------+--------------+ FV Mid   Full                                                        +---------+---------------+---------+-----------+----------+--------------+ FV DistalFull                                                        +---------+---------------+---------+-----------+----------+--------------+ PFV      Full                                                        +---------+---------------+---------+-----------+----------+--------------+ POP      Full           Yes      Yes                                 +---------+---------------+---------+-----------+----------+--------------+ PTV      Full                                                        +---------+---------------+---------+-----------+----------+--------------+ PERO     Full                                                        +---------+---------------+---------+-----------+----------+--------------+   +---------+---------------+---------+-----------+----------+--------------+ LEFT     CompressibilityPhasicitySpontaneityPropertiesThrombus Aging +---------+---------------+---------+-----------+----------+--------------+ CFV      Full           Yes  Yes                                 +---------+---------------+---------+-----------+----------+--------------+ SFJ      Full                                                        +---------+---------------+---------+-----------+----------+--------------+ FV Prox  Full                                                        +---------+---------------+---------+-----------+----------+--------------+ FV Mid   Full                                                         +---------+---------------+---------+-----------+----------+--------------+ FV DistalFull                                                        +---------+---------------+---------+-----------+----------+--------------+ PFV      Full                                                        +---------+---------------+---------+-----------+----------+--------------+ POP      Full           Yes      Yes                                 +---------+---------------+---------+-----------+----------+--------------+ PTV      Full                                                        +---------+---------------+---------+-----------+----------+--------------+ PERO     Full                                                        +---------+---------------+---------+-----------+----------+--------------+     Summary: BILATERAL: - No evidence of deep vein thrombosis seen in the lower extremities, bilaterally. - No evidence of superficial venous thrombosis in the lower extremities, bilaterally. -No evidence of popliteal cyst, bilaterally.   *See table(s) above for measurements and observations. Electronically signed by Harold Barban MD on 03/10/2020 at 5:35:16 PM.    Final      Medical Consultants:    None.  Anti-Infectives:   none  Subjective:    Laura Drake relates her breathing is better than yesterday, she relates her nausea has resolved she has no pain.  Objective:    Vitals:   03/10/20 1237 03/10/20 1432 03/10/20 2000 03/11/20 0540  BP: 124/73 124/73 127/77 (!) 129/94  Pulse: 75 75 70 66  Resp: 20 20 16 16   Temp: 97.6 F (36.4 C) 97.6 F (36.4 C) (!) 97.3 F (36.3 C) 97.9 F (36.6 C)  TempSrc:  Oral Oral Oral  SpO2: 94%  96% 94%  Weight:  82.3 kg    Height:  5' 2.99" (1.6 m)     SpO2: 94 % O2 Flow Rate (L/min): 4 L/min   Intake/Output Summary (Last 24 hours) at 03/11/2020 0802 Last data filed at 03/10/2020 1730 Gross per 24 hour  Intake 240 ml    Output 400 ml  Net -160 ml   Filed Weights   03/08/20 2200 03/09/20 1835 03/10/20 1432  Weight: 82.3 kg 82.3 kg 82.3 kg    Exam: General exam: In no acute distress. Respiratory system: Good air movement and clear to auscultation. Cardiovascular system: S1 & S2 heard, RRR.  Gastrointestinal system: Abdomen is nondistended, soft and nontender.  Extremities: No pedal edema. Skin: No rashes, lesions or ulcers Psychiatry: Judgement and insight appear normal. Mood & affect appropriate.    Data Reviewed:    Labs: Basic Metabolic Panel: Recent Labs  Lab 03/08/20 1630 03/08/20 1630 03/08/20 1646 03/08/20 1646 03/09/20 0630 03/10/20 0601  NA 128*  --  130*  --  133* 133*  K 4.0   < > 4.2   < > 4.6 4.1  CL 94*  --  96*  --  101 101  CO2 23  --   --   --  22 22  GLUCOSE 136*  --  140*  --  205* 186*  BUN 16  --  16  --  19 31*  CREATININE 0.90  --  0.80  --  0.93 1.01*  CALCIUM 9.3  --   --   --  9.3 9.3   < > = values in this interval not displayed.   GFR Estimated Creatinine Clearance: 44.4 mL/min (A) (by C-G formula based on SCr of 1.01 mg/dL (H)). Liver Function Tests: Recent Labs  Lab 03/08/20 1630 03/09/20 0630 03/10/20 0601  AST 32 23 21  ALT 48* 41 33  ALKPHOS 78 72 71  BILITOT 1.0 0.4 0.6  PROT 7.2 6.7 6.5  ALBUMIN 2.8* 2.6* 2.5*   No results for input(s): LIPASE, AMYLASE in the last 168 hours. No results for input(s): AMMONIA in the last 168 hours. Coagulation profile No results for input(s): INR, PROTIME in the last 168 hours. COVID-19 Labs  Recent Labs    03/08/20 1630 03/08/20 1630 03/09/20 0630 03/10/20 0601 03/11/20 0454  DDIMER 4.76*   < > 4.69* 3.00* 2.68*  FERRITIN 909*  --   --   --   --   LDH 115  --   --   --   --   CRP 31.0*  --  28.1* 13.4*  --    < > = values in this interval not displayed.    Lab Results  Component Value Date   SARSCOV2NAA POSITIVE (A) 03/08/2020    CBC: Recent Labs  Lab 03/08/20 1630 03/08/20 1646  03/09/20 0630 03/10/20 0601  WBC 16.4*  --  12.2* 20.3*  NEUTROABS 13.5*  --  11.3* 18.4*  HGB 12.7 14.3 12.9 11.3*  HCT 38.7 42.0 40.4 34.9*  MCV 92.4  --  92.9 92.1  PLT 224  --  253 288   Cardiac Enzymes: No results for input(s): CKTOTAL, CKMB, CKMBINDEX, TROPONINI in the last 168 hours. BNP (last 3 results) No results for input(s): PROBNP in the last 8760 hours. CBG: Recent Labs  Lab 03/08/20 2314 03/09/20 0836 03/09/20 1749 03/09/20 2052 03/10/20 0739  GLUCAP 209* 168* 241* 218* 162*   D-Dimer: Recent Labs    03/10/20 0601 03/11/20 0454  DDIMER 3.00* 2.68*   Hgb A1c: Recent Labs    03/08/20 2315  HGBA1C 6.3*   Lipid Profile: Recent Labs    03/08/20 1630  TRIG 70   Thyroid function studies: No results for input(s): TSH, T4TOTAL, T3FREE, THYROIDAB in the last 72 hours.  Invalid input(s): FREET3 Anemia work up: Recent Labs    03/08/20 1630  FERRITIN 909*   Sepsis Labs: Recent Labs  Lab 03/08/20 1630 03/08/20 1855 03/09/20 0630 03/10/20 0601  PROCALCITON 0.50  --   --   --   WBC 16.4*  --  12.2* 20.3*  LATICACIDVEN 1.9 0.8  --   --    Microbiology Recent Results (from the past 240 hour(s))  Resp Panel by RT-PCR (Flu A&B, Covid) Nasopharyngeal Swab     Status: Abnormal   Collection Time: 03/08/20  4:14 PM   Specimen: Nasopharyngeal Swab; Nasopharyngeal(NP) swabs in vial transport medium  Result Value Ref Range Status   SARS Coronavirus 2 by RT PCR POSITIVE (A) NEGATIVE Final    Comment: RESULT CALLED TO, READ BACK BY AND VERIFIED WITH: DR. Darl Householder AT 1443 ON 03/08/20 BY N.THOMPSON (NOTE) SARS-CoV-2 target nucleic acids are DETECTED.  The SARS-CoV-2 RNA is generally detectable in upper respiratory specimens during the acute phase of infection. Positive results are indicative of the presence of the identified virus, but do not rule out bacterial infection or co-infection with other pathogens not detected by the test. Clinical correlation with  patient history and other diagnostic information is necessary to determine patient infection status. The expected result is Negative.  Fact Sheet for Patients: EntrepreneurPulse.com.au  Fact Sheet for Healthcare Providers: IncredibleEmployment.be  This test is not yet approved or cleared by the [place] FDA and  has been authorized for detection and/or diagnosis of SARS-CoV-2 by FDA under an Emergency Use Authorization (EUA).  This EUA will remain in effect (meaning this test  can be used) for the duration of  the COVID-19 declaration under Section 564(b)(1) of the Act, 21 U.S.C. section 360bbb-3(b)(1), unless the authorization is terminated or revoked sooner.     Influenza A by PCR NEGATIVE NEGATIVE Final   Influenza B by PCR NEGATIVE NEGATIVE Final    Comment: (NOTE) The Xpert Xpress SARS-CoV-2/FLU/RSV plus assay is intended as an aid in the diagnosis of influenza from Nasopharyngeal swab specimens and should not be used as a sole basis for treatment. Nasal washings and aspirates are unacceptable for Xpert Xpress SARS-CoV-2/FLU/RSV testing.  Fact Sheet for Patients: EntrepreneurPulse.com.au  Fact Sheet for Healthcare Providers: IncredibleEmployment.be  This test is not yet approved or cleared by the [place] FDA and has been authorized for detection and/or diagnosis of SARS-CoV-2 by FDA under an Emergency Use Authorization (EUA). This EUA will remain in effect (meaning this test can be used) for the duration of the COVID-19 declaration under Section 564(b)(1) of the Act, 21 U.S.C. section 360bbb-3(b)(1), unless the authorization is terminated or revoked.  Performed at [facility] [place]  [address], [place], [place] [address]   Blood Culture (routine x 2)     Status: None (Preliminary result)   Collection Time: 03/08/20  4:20 PM   Specimen: BLOOD  Result Value Ref Range  Status   Specimen Description   Final    BLOOD RIGHT ANTECUBITAL Performed at [facility] [address] [place], [place] [address]    Special Requests   Final    BOTTLES DRAWN AEROBIC AND ANAEROBIC Blood Culture results may not be optimal due to an inadequate volume of blood received in culture bottles Performed at [facility] [address] [place], [place] [address]    Culture   Final    NO GROWTH 2 DAYS Performed at [facility] [address], [place], [place] [address]    Report Status PENDING  Incomplete  Blood Culture (routine x 2)     Status: None (Preliminary result)   Collection Time: 03/08/20  4:30 PM   Specimen: BLOOD  Result Value Ref Range Status   Specimen Description   Final    BLOOD RIGHT WRIST Performed at [facility] [address], [place], [place] [address]    Special Requests   Final    BOTTLES DRAWN AEROBIC AND ANAEROBIC Blood Culture adequate volume Performed at [facility] [address] [place], [place] [address]    Culture   Final    NO GROWTH 2 DAYS Performed at [facility] [address], [place], [place] [address]    Report Status PENDING  Incomplete     Medications:   . enoxaparin (LOVENOX) injection  40 mg Subcutaneous Q24H  . FLUoxetine  20 mg Oral Daily  . levothyroxine  50 mcg Oral Q0600  . lithium carbonate  300 mg Oral Daily  . methylPREDNISolone (SOLU-MEDROL) injection  40 mg Intravenous Q12H   Followed by  . [START ON 03/12/2020] predniSONE  50 mg Oral Daily   Continuous Infusions: . azithromycin 500 mg (03/10/20 2039)  . cefTRIAXone (ROCEPHIN)  IV 2 g (03/10/20 2235)      LOS: 3 days   Charlynne Cousins  [facility]  03/11/2020, 8:02 AM

## 2020-03-11 NOTE — Plan of Care (Signed)
  Problem: Education: Goal: Knowledge of risk factors and measures for prevention of condition will improve Outcome: Progressing   Problem: Coping: Goal: Psychosocial and spiritual needs will be supported Outcome: Progressing   Problem: Respiratory: Goal: Will maintain a patent airway Outcome: Progressing   Problem: Education: Goal: Knowledge of risk factors and measures for prevention of condition will improve Outcome: Progressing   Problem: Coping: Goal: Psychosocial and spiritual needs will be supported Outcome: Progressing   Problem: Respiratory: Goal: Will maintain a patent airway Outcome: Progressing Goal: Complications related to the disease process, condition or treatment will be avoided or minimized Outcome: Progressing

## 2020-03-11 NOTE — Plan of Care (Signed)
  Problem: Education: Goal: Knowledge of risk factors and measures for prevention of condition will improve Outcome: Progressing   Problem: Coping: Goal: Psychosocial and spiritual needs will be supported Outcome: Progressing   Problem: Respiratory: Goal: Will maintain a patent airway Outcome: Progressing Goal: Complications related to the disease process, condition or treatment will be avoided or minimized Outcome: Progressing   Problem: Education: Goal: Knowledge of risk factors and measures for prevention of condition will improve Outcome: Progressing   Problem: Coping: Goal: Psychosocial and spiritual needs will be supported Outcome: Progressing   Problem: Respiratory: Goal: Will maintain a patent airway Outcome: Progressing Goal: Complications related to the disease process, condition or treatment will be avoided or minimized Outcome: Progressing   Problem: Education: Goal: Knowledge of General Education information will improve Description: Including pain rating scale, medication(s)/side effects and non-pharmacologic comfort measures Outcome: Progressing   Problem: Health Behavior/Discharge Planning: Goal: Ability to manage health-related needs will improve Outcome: Progressing   Problem: Clinical Measurements: Goal: Ability to maintain clinical measurements within normal limits will improve Outcome: Progressing Goal: Will remain free from infection Outcome: Progressing Goal: Diagnostic test results will improve Outcome: Progressing Goal: Respiratory complications will improve Outcome: Progressing Goal: Cardiovascular complication will be avoided Outcome: Progressing   Problem: Activity: Goal: Risk for activity intolerance will decrease Outcome: Progressing   Problem: Nutrition: Goal: Adequate nutrition will be maintained Outcome: Progressing   Problem: Coping: Goal: Level of anxiety will decrease Outcome: Progressing   Problem: Elimination: Goal:  Will not experience complications related to bowel motility Outcome: Progressing Goal: Will not experience complications related to urinary retention Outcome: Progressing   Problem: Pain Managment: Goal: General experience of comfort will improve Outcome: Progressing   Problem: Safety: Goal: Ability to remain free from injury will improve Outcome: Progressing   Problem: Skin Integrity: Goal: Risk for impaired skin integrity will decrease Outcome: Progressing

## 2020-03-12 ENCOUNTER — Encounter (HOSPITAL_COMMUNITY): Payer: Self-pay | Admitting: Internal Medicine

## 2020-03-12 LAB — D-DIMER, QUANTITATIVE: D-Dimer, Quant: 2.54 ug/mL-FEU — ABNORMAL HIGH (ref 0.00–0.50)

## 2020-03-12 LAB — CBC WITH DIFFERENTIAL/PLATELET
Abs Immature Granulocytes: 0.16 10*3/uL — ABNORMAL HIGH (ref 0.00–0.07)
Basophils Absolute: 0 10*3/uL (ref 0.0–0.1)
Basophils Relative: 0 %
Eosinophils Absolute: 0.3 10*3/uL (ref 0.0–0.5)
Eosinophils Relative: 3 %
HCT: 36.9 % (ref 36.0–46.0)
Hemoglobin: 11.8 g/dL — ABNORMAL LOW (ref 12.0–15.0)
Immature Granulocytes: 2 %
Lymphocytes Relative: 24 %
Lymphs Abs: 2.3 10*3/uL (ref 0.7–4.0)
MCH: 30.1 pg (ref 26.0–34.0)
MCHC: 32 g/dL (ref 30.0–36.0)
MCV: 94.1 fL (ref 80.0–100.0)
Monocytes Absolute: 0.7 10*3/uL (ref 0.1–1.0)
Monocytes Relative: 7 %
Neutro Abs: 6.2 10*3/uL (ref 1.7–7.7)
Neutrophils Relative %: 64 %
Platelets: 280 10*3/uL (ref 150–400)
RBC: 3.92 MIL/uL (ref 3.87–5.11)
RDW: 14 % (ref 11.5–15.5)
WBC: 9.8 10*3/uL (ref 4.0–10.5)
nRBC: 0 % (ref 0.0–0.2)

## 2020-03-12 LAB — COMPREHENSIVE METABOLIC PANEL
ALT: 30 U/L (ref 0–44)
AST: 28 U/L (ref 15–41)
Albumin: 2.6 g/dL — ABNORMAL LOW (ref 3.5–5.0)
Alkaline Phosphatase: 60 U/L (ref 38–126)
Anion gap: 10 (ref 5–15)
BUN: 30 mg/dL — ABNORMAL HIGH (ref 8–23)
CO2: 22 mmol/L (ref 22–32)
Calcium: 9.2 mg/dL (ref 8.9–10.3)
Chloride: 107 mmol/L (ref 98–111)
Creatinine, Ser: 0.84 mg/dL (ref 0.44–1.00)
GFR, Estimated: >60 mL/min (ref >60)
Glucose, Bld: 87 mg/dL (ref 70–99)
Potassium: 4.4 mmol/L (ref 3.5–5.1)
Sodium: 139 mmol/L (ref 135–145)
Total Bilirubin: 0.4 mg/dL (ref 0.3–1.2)
Total Protein: 6 g/dL — ABNORMAL LOW (ref 6.5–8.1)

## 2020-03-12 LAB — C-REACTIVE PROTEIN: CRP: 3.3 mg/dL — ABNORMAL HIGH (ref <1.0)

## 2020-03-12 LAB — PROCALCITONIN: Procalcitonin: <0.1 ng/mL

## 2020-03-12 NOTE — Progress Notes (Signed)
[facility] PROGRESS NOTE    Progress Note  Laura Drake  WUJ:[id number] DOB: 1940-11-06 DOA: 03/08/2020 PCP: Merryl Hacker, No     Brief Narrative:   Laura Drake is an 79 y.o. female recently discharged from the hospital on 4 L in November due to COVID-19 returns on 05/10/2019 hypoxic requiring 2 L of high flow nasal cannula CT angio revealed bilateral diffuse infiltrates with no evidence of PE CRP was 31 white count was 16 (she had been on steroids) D-dimer 4.7 due to suspicious to superimposed pneumonia she was started on IV Vanco and cefepime, her family declined remdesivir.  Assessment/Plan:   Acute respiratory distress syndrome (ARDS) due to COVID-19 virus (HCC) and possibly bacterial pneumonia: Her isolation was completed on 03/03/2020.  She was readmitted in March 08, 2020 Continue IV Rocephin and azithromycin for total of 5 days, culture data has remained negative till date. Oxygen requirements are slowly improving, this morning she is on 4 L nasal cannula. Encouraged patient out of bed to chair and prone position, encourage incentive spirometry. Continue Lovenox for DVT prophylaxis. To be evaluated the patient and recommended home health PT with 24-hour supervision or skilled nursing facility.  Elevated D-dimer: No PE and CT angio lower extremity Doppler was negative for DVT. Continue Lovenox prophylactic dose.  Bipolar disorder: Continue lithium and SSRI.  Steroid-induced hyperglycemia: With an A1c of 6.3, currently on sliding scale, but she consistently declined CBGs and insulin so it was discontinued.  Diuretics on: Continue Synthroid.  Mild hyponatremia: Noted.   DVT prophylaxis: lovenxo Family Communication:none Status is: Inpatient  Remains inpatient appropriate because:Hemodynamically unstable   Dispo: The patient is from: SNF              Anticipated d/c is to: SNF              Anticipated d/c date is: > 3 days              Patient currently is not  medically stable to d/c.        Code Status:     Code Status Orders  (From admission, onward)         Start     Ordered   03/08/20 2145  Do not attempt resuscitation (DNR)  Continuous       Question Answer Comment  In the event of cardiac or respiratory ARREST Do not call a "code blue"   In the event of cardiac or respiratory ARREST Do not perform Intubation, CPR, defibrillation or ACLS   In the event of cardiac or respiratory ARREST Use medication by any route, position, wound care, and other measures to relive pain and suffering. May use oxygen, suction and manual treatment of airway obstruction as needed for comfort.      03/08/20 2150        Code Status History    This patient has a current code status but no historical code status.   Advance Care Planning Activity        IV Access:    Peripheral IV   Procedures and diagnostic studies:   No results found.   Medical Consultants:    None.  Anti-Infectives:   none  Subjective:    Wardell Honour in a better mood this morning she relates she is getting her energy back tolerated her diet.  Objective:    Vitals:   03/11/20 0540 03/11/20 1247 03/11/20 2115 03/12/20 0511  BP: (!) 129/94 118/76 125/77 125/69  Pulse: 66 72 71  68  Resp: 16 20 18 18   Temp: 97.9 F (36.6 C) 98.4 F (36.9 C) 98 F (36.7 C) 97.8 F (36.6 C)  TempSrc: Oral Oral Oral Oral  SpO2: 94% 91% 95% 95%  Weight:      Height:       SpO2: 95 % O2 Flow Rate (L/min): 4 L/min   Intake/Output Summary (Last 24 hours) at 03/12/2020 0758 Last data filed at 03/12/2020 0730 Gross per 24 hour  Intake 600 ml  Output 640 ml  Net -40 ml   Filed Weights   03/08/20 2200 03/09/20 1835 03/10/20 1432  Weight: 82.3 kg 82.3 kg 82.3 kg    Exam: General exam: In no acute distress. Respiratory system: Good air movement and clear to auscultation. Cardiovascular system: S1 & S2 heard, RRR. No JVD. Gastrointestinal system: Abdomen is  nondistended, soft and nontender.  Extremities: No pedal edema. Skin: No rashes, lesions or ulcers  Data Reviewed:    Labs: Basic Metabolic Panel: Recent Labs  Lab 03/08/20 1630 03/08/20 1630 03/08/20 1646 03/08/20 1646 03/09/20 0630 03/09/20 0630 03/10/20 0601 03/10/20 0601 03/11/20 1000 03/12/20 0440  NA 128*   < > 130*  --  133*  --  133*  --  136 139  K 4.0   < > 4.2   < > 4.6   < > 4.1   < > 4.5 4.4  CL 94*   < > 96*  --  101  --  101  --  106 107  CO2 23  --   --   --  22  --  22  --  21* 22  GLUCOSE 136*   < > 140*  --  205*  --  186*  --  112* 87  BUN 16   < > 16  --  19  --  31*  --  30* 30*  CREATININE 0.90   < > 0.80  --  0.93  --  1.01*  --  0.90 0.84  CALCIUM 9.3  --   --   --  9.3  --  9.3  --  9.1 9.2   < > = values in this interval not displayed.   GFR Estimated Creatinine Clearance: 53.4 mL/min (by C-G formula based on SCr of 0.84 mg/dL). Liver Function Tests: Recent Labs  Lab 03/08/20 1630 03/09/20 0630 03/10/20 0601 03/11/20 1000 03/12/20 0440  AST 32 23 21 19 28   ALT 48* 41 33 26 30  ALKPHOS 78 72 71 61 60  BILITOT 1.0 0.4 0.6 0.4 0.4  PROT 7.2 6.7 6.5 6.0* 6.0*  ALBUMIN 2.8* 2.6* 2.5* 2.4* 2.6*   No results for input(s): LIPASE, AMYLASE in the last 168 hours. No results for input(s): AMMONIA in the last 168 hours. Coagulation profile No results for input(s): INR, PROTIME in the last 168 hours. COVID-19 Labs  Recent Labs    03/10/20 0601 03/11/20 0454 03/11/20 0837 03/12/20 0440  DDIMER 3.00* 2.68*  --  2.54*  CRP 13.4*  --  6.2* 3.3*    Lab Results  Component Value Date   SARSCOV2NAA POSITIVE (A) 03/08/2020    CBC: Recent Labs  Lab 03/08/20 1630 03/08/20 1630 03/08/20 1646 03/09/20 0630 03/10/20 0601 03/11/20 1000 03/12/20 0440  WBC 16.4*  --   --  12.2* 20.3* 12.9* 9.8  NEUTROABS 13.5*  --   --  11.3* 18.4* 10.9* 6.2  HGB 12.7   < > 14.3 12.9 11.3* 11.7* 11.8*  HCT 38.7   < >  42.0 40.4 34.9* 36.4 36.9  MCV 92.4   --   --  92.9 92.1 93.8 94.1  PLT 224  --   --  253 288 294 280   < > = values in this interval not displayed.   Cardiac Enzymes: No results for input(s): CKTOTAL, CKMB, CKMBINDEX, TROPONINI in the last 168 hours. BNP (last 3 results) No results for input(s): PROBNP in the last 8760 hours. CBG: Recent Labs  Lab 03/08/20 2314 03/09/20 0836 03/09/20 1749 03/09/20 2052 03/10/20 0739  GLUCAP 209* 168* 241* 218* 162*   D-Dimer: Recent Labs    03/11/20 0454 03/12/20 0440  DDIMER 2.68* 2.54*   Hgb A1c: No results for input(s): HGBA1C in the last 72 hours. Lipid Profile: No results for input(s): CHOL, HDL, LDLCALC, TRIG, CHOLHDL, LDLDIRECT in the last 72 hours. Thyroid function studies: No results for input(s): TSH, T4TOTAL, T3FREE, THYROIDAB in the last 72 hours.  Invalid input(s): FREET3 Anemia work up: No results for input(s): VITAMINB12, FOLATE, FERRITIN, TIBC, IRON, RETICCTPCT in the last 72 hours. Sepsis Labs: Recent Labs  Lab 03/08/20 1630 03/08/20 1630 03/08/20 1855 03/09/20 0630 03/10/20 0601 03/11/20 0837 03/11/20 1000 03/12/20 0440  PROCALCITON 0.50  --   --   --   --  0.26  --  <0.10  WBC 16.4*   < >  --  12.2* 20.3*  --  12.9* 9.8  LATICACIDVEN 1.9  --  0.8  --   --   --   --   --    < > = values in this interval not displayed.   Microbiology Recent Results (from the past 240 hour(s))  Resp Panel by RT-PCR (Flu A&B, Covid) Nasopharyngeal Swab     Status: Abnormal   Collection Time: 03/08/20  4:14 PM   Specimen: Nasopharyngeal Swab; Nasopharyngeal(NP) swabs in vial transport medium  Result Value Ref Range Status   SARS Coronavirus 2 by RT PCR POSITIVE (A) NEGATIVE Final    Comment: RESULT CALLED TO, READ BACK BY AND VERIFIED WITH: DR. Darl Householder AT 4431 ON 03/08/20 BY N.THOMPSON (NOTE) SARS-CoV-2 target nucleic acids are DETECTED.  The SARS-CoV-2 RNA is generally detectable in upper respiratory specimens during the acute phase of infection. Positive results  are indicative of the presence of the identified virus, but do not rule out bacterial infection or co-infection with other pathogens not detected by the test. Clinical correlation with patient history and other diagnostic information is necessary to determine patient infection status. The expected result is Negative.  Fact Sheet for Patients: EntrepreneurPulse.com.au  Fact Sheet for Healthcare Providers: IncredibleEmployment.be  This test is not yet approved or cleared by the [place] FDA and  has been authorized for detection and/or diagnosis of SARS-CoV-2 by FDA under an Emergency Use Authorization (EUA).  This EUA will remain in effect (meaning this test  can be used) for the duration of  the COVID-19 declaration under Section 564(b)(1) of the Act, 21 U.S.C. section 360bbb-3(b)(1), unless the authorization is terminated or revoked sooner.     Influenza A by PCR NEGATIVE NEGATIVE Final   Influenza B by PCR NEGATIVE NEGATIVE Final    Comment: (NOTE) The Xpert Xpress SARS-CoV-2/FLU/RSV plus assay is intended as an aid in the diagnosis of influenza from Nasopharyngeal swab specimens and should not be used as a sole basis for treatment. Nasal washings and aspirates are unacceptable for Xpert Xpress SARS-CoV-2/FLU/RSV testing.  Fact Sheet for Patients: EntrepreneurPulse.com.au  Fact Sheet for Healthcare Providers: IncredibleEmployment.be  This test  is not yet approved or cleared by the [place] and has been authorized for detection and/or diagnosis of SARS-CoV-2 by FDA under an Emergency Use Authorization (EUA). This EUA will remain in effect (meaning this test can be used) for the duration of the COVID-19 declaration under Section 564(b)(1) of the Act, 21 U.S.C. section 360bbb-3(b)(1), unless the authorization is terminated or revoked.  Performed at [facility], [place]  [address], [place], [place] [address]   Blood Culture (routine x 2)     Status: None (Preliminary result)   Collection Time: 03/08/20  4:20 PM   Specimen: BLOOD  Result Value Ref Range Status   Specimen Description   Final    BLOOD RIGHT ANTECUBITAL Performed at [facility] [address] [place], [place] [address]    Special Requests   Final    BOTTLES DRAWN AEROBIC AND ANAEROBIC Blood Culture results may not be optimal due to an inadequate volume of blood received in culture bottles Performed at [facility] [address] [place], [place] [address]    Culture   Final    NO GROWTH 4 DAYS Performed at [facility], [place] [address], [place], [place] [address]    Report Status PENDING  Incomplete  Blood Culture (routine x 2)     Status: None (Preliminary result)   Collection Time: 03/08/20  4:30 PM   Specimen: BLOOD  Result Value Ref Range Status   Specimen Description   Final    BLOOD RIGHT WRIST Performed at [facility] [address], [place], [place] [address]    Special Requests   Final    BOTTLES DRAWN AEROBIC AND ANAEROBIC Blood Culture adequate volume Performed at [facility] [address], [place], [place] [address]    Culture   Final    NO GROWTH 4 DAYS Performed at [facility], [place] [address], [place], [place] [address]    Report Status PENDING  Incomplete  MRSA PCR Screening     Status: None   Collection Time: 03/09/20  2:28 PM   Specimen: Nasal Mucosa; Nasopharyngeal  Result Value Ref Range Status   MRSA by PCR NEGATIVE NEGATIVE Final    Comment:        The GeneXpert MRSA Assay (FDA approved for NASAL specimens only), is one component of a comprehensive MRSA colonization surveillance program. It is not intended to diagnose MRSA infection nor to guide or monitor treatment for MRSA infections. Performed at [facility], [place] [address],  [place],  [address]      Medications:   . azithromycin  500 mg Oral QHS  . enoxaparin (LOVENOX) injection  40 mg Subcutaneous Q24H  . FLUoxetine  20 mg Oral Daily  . levothyroxine  50 mcg Oral Q0600  . lithium carbonate  300 mg Oral Daily  . predniSONE  50 mg Oral Daily   Continuous Infusions: . cefTRIAXone (ROCEPHIN)  IV 2 g (03/12/20 0022)      LOS: 4 days   Charlynne Cousins  [facility]  03/12/2020, 7:58 AM

## 2020-03-12 NOTE — TOC Progression Note (Signed)
Transition of Care [facility]) - Progression Note    Patient Details  Name: Laura Drake MRN: [id number] Date of Birth: 1941-03-26  [facility] CM/SW Contact  Purcell Mouton, RN Phone Number: 03/12/2020, 2:27 PM  Clinical Narrative:    Pt is active with [facility] for HHRN,NA,PT,OT. Will need orders at discharge.    Expected Discharge Plan: [place] Barriers to Discharge: No Barriers Identified  Expected Discharge Plan and Services Expected Discharge Plan: [place] arrangements for the past 2 months: Single Family Home                                       Social Determinants of Health (SDOH) Interventions    Readmission Risk Interventions No flowsheet data found.

## 2020-03-12 NOTE — Plan of Care (Signed)
  Problem: Health Behavior/Discharge Planning: Goal: Ability to manage health-related needs will improve Outcome: Progressing   Problem: Clinical Measurements: Goal: Ability to maintain clinical measurements within normal limits will improve Outcome: Progressing Goal: Respiratory complications will improve Outcome: Progressing   Problem: Activity: Goal: Risk for activity intolerance will decrease Outcome: Progressing   Problem: Nutrition: Goal: Adequate nutrition will be maintained Outcome: Progressing   Problem: Coping: Goal: Level of anxiety will decrease Outcome: Progressing

## 2020-03-12 NOTE — Plan of Care (Signed)
  Problem: Education: Goal: Knowledge of risk factors and measures for prevention of condition will improve Outcome: Progressing   Problem: Coping: Goal: Psychosocial and spiritual needs will be supported Outcome: Progressing   Problem: Respiratory: Goal: Will maintain a patent airway Outcome: Progressing Goal: Complications related to the disease process, condition or treatment will be avoided or minimized Outcome: Progressing   Problem: Education: Goal: Knowledge of risk factors and measures for prevention of condition will improve Outcome: Progressing   Problem: Coping: Goal: Psychosocial and spiritual needs will be supported Outcome: Progressing   Problem: Respiratory: Goal: Will maintain a patent airway Outcome: Progressing Goal: Complications related to the disease process, condition or treatment will be avoided or minimized Outcome: Progressing   Problem: Education: Goal: Knowledge of General Education information will improve Description: Including pain rating scale, medication(s)/side effects and non-pharmacologic comfort measures Outcome: Progressing   Problem: Health Behavior/Discharge Planning: Goal: Ability to manage health-related needs will improve Outcome: Progressing   Problem: Clinical Measurements: Goal: Ability to maintain clinical measurements within normal limits will improve Outcome: Progressing Goal: Will remain free from infection Outcome: Progressing Goal: Diagnostic test results will improve Outcome: Progressing Goal: Respiratory complications will improve Outcome: Progressing Goal: Cardiovascular complication will be avoided Outcome: Progressing   Problem: Activity: Goal: Risk for activity intolerance will decrease Outcome: Progressing   Problem: Nutrition: Goal: Adequate nutrition will be maintained Outcome: Progressing   Problem: Coping: Goal: Level of anxiety will decrease Outcome: Progressing   Problem: Elimination: Goal:  Will not experience complications related to bowel motility Outcome: Progressing Goal: Will not experience complications related to urinary retention Outcome: Progressing   Problem: Pain Managment: Goal: General experience of comfort will improve Outcome: Progressing   Problem: Safety: Goal: Ability to remain free from injury will improve Outcome: Progressing   Problem: Skin Integrity: Goal: Risk for impaired skin integrity will decrease Outcome: Progressing

## 2020-03-13 LAB — CULTURE, BLOOD (ROUTINE X 2)
Culture: NO GROWTH
Culture: NO GROWTH
Special Requests: ADEQUATE

## 2020-03-13 LAB — COMPREHENSIVE METABOLIC PANEL
ALT: 34 U/L (ref 0–44)
AST: 28 U/L (ref 15–41)
Albumin: 2.7 g/dL — ABNORMAL LOW (ref 3.5–5.0)
Alkaline Phosphatase: 55 U/L (ref 38–126)
Anion gap: 6 (ref 5–15)
BUN: 24 mg/dL — ABNORMAL HIGH (ref 8–23)
CO2: 25 mmol/L (ref 22–32)
Calcium: 8.9 mg/dL (ref 8.9–10.3)
Chloride: 105 mmol/L (ref 98–111)
Creatinine, Ser: 0.79 mg/dL (ref 0.44–1.00)
GFR, Estimated: >60 mL/min (ref >60)
Glucose, Bld: 102 mg/dL — ABNORMAL HIGH (ref 70–99)
Potassium: 4 mmol/L (ref 3.5–5.1)
Sodium: 136 mmol/L (ref 135–145)
Total Bilirubin: 0.6 mg/dL (ref 0.3–1.2)
Total Protein: 5.8 g/dL — ABNORMAL LOW (ref 6.5–8.1)

## 2020-03-13 LAB — CBC WITH DIFFERENTIAL/PLATELET
Abs Immature Granulocytes: 0.42 10*3/uL — ABNORMAL HIGH (ref 0.00–0.07)
Basophils Absolute: 0 10*3/uL (ref 0.0–0.1)
Basophils Relative: 0 %
Eosinophils Absolute: 0.3 10*3/uL (ref 0.0–0.5)
Eosinophils Relative: 3 %
HCT: 37 % (ref 36.0–46.0)
Hemoglobin: 12 g/dL (ref 12.0–15.0)
Immature Granulocytes: 4 %
Lymphocytes Relative: 18 %
Lymphs Abs: 2.1 10*3/uL (ref 0.7–4.0)
MCH: 30.3 pg (ref 26.0–34.0)
MCHC: 32.4 g/dL (ref 30.0–36.0)
MCV: 93.4 fL (ref 80.0–100.0)
Monocytes Absolute: 0.6 10*3/uL (ref 0.1–1.0)
Monocytes Relative: 5 %
Neutro Abs: 8.1 10*3/uL — ABNORMAL HIGH (ref 1.7–7.7)
Neutrophils Relative %: 70 %
Platelets: 302 10*3/uL (ref 150–400)
RBC: 3.96 MIL/uL (ref 3.87–5.11)
RDW: 13.8 % (ref 11.5–15.5)
WBC: 11.5 10*3/uL — ABNORMAL HIGH (ref 4.0–10.5)
nRBC: 0 % (ref 0.0–0.2)

## 2020-03-13 LAB — LEGIONELLA PNEUMOPHILA SEROGP 1 UR AG: L. pneumophila Serogp 1 Ur Ag: NEGATIVE

## 2020-03-13 LAB — D-DIMER, QUANTITATIVE: D-Dimer, Quant: 6.6 ug/mL-FEU — ABNORMAL HIGH (ref 0.00–0.50)

## 2020-03-13 LAB — C-REACTIVE PROTEIN: CRP: 2.1 mg/dL — ABNORMAL HIGH (ref <1.0)

## 2020-03-13 MED ORDER — PREDNISONE 20 MG PO TABS
20.0000 mg | ORAL_TABLET | Freq: Every day | ORAL | Status: DC
  Administered 2020-03-13 – 2020-03-14 (×2): 20 mg via ORAL
  Filled 2020-03-13 (×2): qty 1

## 2020-03-13 MED ORDER — PREDNISONE 10 MG PO TABS: ORAL_TABLET | ORAL | 0 refills | Status: AC

## 2020-03-13 MED ORDER — PSYLLIUM 95 % PO PACK: 1.0000 | PACK | Freq: Every day | ORAL | 0 refills | Status: AC

## 2020-03-13 MED ORDER — PSYLLIUM 95 % PO PACK
1.0000 | PACK | Freq: Every day | ORAL | Status: DC
  Administered 2020-03-13: 1 via ORAL
  Filled 2020-03-13 (×2): qty 1

## 2020-03-13 MED ORDER — PREDNISONE 10 MG PO TABS: ORAL_TABLET | ORAL | 0 refills | Status: DC

## 2020-03-13 NOTE — Plan of Care (Signed)
  Problem: Education: Goal: Knowledge of risk factors and measures for prevention of condition will improve Outcome: Progressing   Problem: Coping: Goal: Psychosocial and spiritual needs will be supported Outcome: Progressing   Problem: Respiratory: Goal: Will maintain a patent airway Outcome: Progressing Goal: Complications related to the disease process, condition or treatment will be avoided or minimized Outcome: Progressing   Problem: Education: Goal: Knowledge of risk factors and measures for prevention of condition will improve Outcome: Progressing   Problem: Coping: Goal: Psychosocial and spiritual needs will be supported Outcome: Progressing   Problem: Respiratory: Goal: Will maintain a patent airway Outcome: Progressing Goal: Complications related to the disease process, condition or treatment will be avoided or minimized Outcome: Progressing   Problem: Education: Goal: Knowledge of General Education information will improve Description: Including pain rating scale, medication(s)/side effects and non-pharmacologic comfort measures Outcome: Progressing   Problem: Health Behavior/Discharge Planning: Goal: Ability to manage health-related needs will improve Outcome: Progressing   Problem: Clinical Measurements: Goal: Ability to maintain clinical measurements within normal limits will improve Outcome: Progressing Goal: Will remain free from infection Outcome: Progressing Goal: Diagnostic test results will improve Outcome: Progressing Goal: Respiratory complications will improve Outcome: Progressing Goal: Cardiovascular complication will be avoided Outcome: Progressing   Problem: Activity: Goal: Risk for activity intolerance will decrease Outcome: Progressing   Problem: Nutrition: Goal: Adequate nutrition will be maintained Outcome: Progressing   Problem: Coping: Goal: Level of anxiety will decrease Outcome: Progressing   Problem: Elimination: Goal:  Will not experience complications related to bowel motility Outcome: Progressing Goal: Will not experience complications related to urinary retention Outcome: Progressing   Problem: Pain Managment: Goal: General experience of comfort will improve Outcome: Progressing   Problem: Safety: Goal: Ability to remain free from injury will improve Outcome: Progressing   Problem: Skin Integrity: Goal: Risk for impaired skin integrity will decrease Outcome: Progressing

## 2020-03-13 NOTE — Progress Notes (Incomplete)
[facility] PROGRESS NOTE    Progress Note  Laura Drake  TDD:[id number] DOB: 10/15/1940 DOA: 03/08/2020 PCP: Merryl Hacker, No     Brief Narrative:   Laura Drake is an 79 y.o. female recently discharged from the hospital on 4 L in November due to COVID-19 returns on 05/10/2019 hypoxic requiring 2 L of high flow nasal cannula CT angio revealed bilateral diffuse infiltrates with no evidence of PE CRP was 31 white count was 16 (she had been on steroids) D-dimer 4.7 due to suspicious to superimposed pneumonia she was started on IV Vanco and cefepime, her family declined remdesivir.  Assessment/Plan:   Acute respiratory distress syndrome (ARDS) due to COVID-19 virus (HCC) and possibly bacterial pneumonia: Her isolation was completed on 03/03/2020.  She was readmitted in March 08, 2020. She has completed treatment of antibiotics, culture data has remained negative till date. She still requiring 4 L of high flow nasal cannula to keep saturations greater 90%, will need to wean to room air, encourage incentive spirometry. She has been persistently above 92% anything above 90%, will be good on her with discussed with the nurse to try to wean her oxygen down. Encouraged patient out of bed to chair and prone position, encourage incentive spirometry. Continue Lovenox for DVT prophylaxis. We will taper her steroids down quickly.  Elevated D-dimer: No PE and CT angio lower extremity Doppler was negative for DVT. Continue Lovenox prophylactic dose.  Bipolar disorder: Continue lithium and SSRI.  Steroid-induced hyperglycemia: With an A1c of 6.3, currently on sliding scale has not required insulin over the last 24 hours, continue to wean down steroids quickly.  Hypothyroidism: Continue Synthroid.  Mild hyponatremia: Noted.   DVT prophylaxis: lovenxo Family Communication:none Status is: Inpatient  Remains inpatient appropriate because:Hemodynamically unstable   Dispo: The patient is  from: SNF              Anticipated d/c is to: SNF              Anticipated d/c date is: > 3 days              Patient currently is not medically stable to d/c.        Code Status:     Code Status Orders  (From admission, onward)         Start     Ordered   03/08/20 2145  Do not attempt resuscitation (DNR)  Continuous       Question Answer Comment  In the event of cardiac or respiratory ARREST Do not call a "code blue"   In the event of cardiac or respiratory ARREST Do not perform Intubation, CPR, defibrillation or ACLS   In the event of cardiac or respiratory ARREST Use medication by any route, position, wound care, and other measures to relive pain and suffering. May use oxygen, suction and manual treatment of airway obstruction as needed for comfort.      03/08/20 2150        Code Status History    This patient has a current code status but no historical code status.   Advance Care Planning Activity        IV Access:    Peripheral IV   Procedures and diagnostic studies:   No results found.   Medical Consultants:    None.  Anti-Infectives:   none  Subjective:    Wardell Honour in a better mood this morning she relates she is getting her energy back tolerated her diet.  Objective:  Vitals:   03/12/20 0511 03/12/20 1234 03/12/20 2121 03/13/20 0540  BP: 125/69 128/62 126/69 (!) 125/99  Pulse: 68 70 73 70  Resp: 18 16 16 16   Temp: 97.8 F (36.6 C) 98.1 F (36.7 C) 98.4 F (36.9 C) 97.8 F (36.6 C)  TempSrc: Oral Oral Oral Oral  SpO2: 95% 92% 95% 96%  Weight:      Height:       SpO2: 96 % O2 Flow Rate (L/min): 4 L/min   Intake/Output Summary (Last 24 hours) at 03/13/2020 0727 Last data filed at 03/12/2020 1823 Gross per 24 hour  Intake 250 ml  Output 1090 ml  Net -840 ml   Filed Weights   03/08/20 2200 03/09/20 1835 03/10/20 1432  Weight: 82.3 kg 82.3 kg 82.3 kg    Exam: General exam: In no acute distress. Respiratory  system: Good air movement and clear to auscultation. Cardiovascular system: S1 & S2 heard, RRR. No JVD. Gastrointestinal system: Abdomen is nondistended, soft and nontender.  Extremities: No pedal edema. Skin: No rashes, lesions or ulcers  Data Reviewed:    Labs: Basic Metabolic Panel: Recent Labs  Lab 03/09/20 0630 03/09/20 0630 03/10/20 0601 03/10/20 0601 03/11/20 1000 03/11/20 1000 03/12/20 0440 03/13/20 0452  NA 133*  --  133*  --  136  --  139 136  K 4.6   < > 4.1   < > 4.5   < > 4.4 4.0  CL 101  --  101  --  106  --  107 105  CO2 22  --  22  --  21*  --  22 25  GLUCOSE 205*  --  186*  --  112*  --  87 102*  BUN 19  --  31*  --  30*  --  30* 24*  CREATININE 0.93  --  1.01*  --  0.90  --  0.84 0.79  CALCIUM 9.3  --  9.3  --  9.1  --  9.2 8.9   < > = values in this interval not displayed.   GFR Estimated Creatinine Clearance: 56.1 mL/min (by C-G formula based on SCr of 0.79 mg/dL). Liver Function Tests: Recent Labs  Lab 03/09/20 0630 03/10/20 0601 03/11/20 1000 03/12/20 0440 03/13/20 0452  AST 23 21 19 28 28   ALT 41 33 26 30 34  ALKPHOS 72 71 61 60 55  BILITOT 0.4 0.6 0.4 0.4 0.6  PROT 6.7 6.5 6.0* 6.0* 5.8*  ALBUMIN 2.6* 2.5* 2.4* 2.6* 2.7*   No results for input(s): LIPASE, AMYLASE in the last 168 hours. No results for input(s): AMMONIA in the last 168 hours. Coagulation profile No results for input(s): INR, PROTIME in the last 168 hours. COVID-19 Labs  Recent Labs    03/11/20 0454 03/11/20 0837 03/12/20 0440 03/13/20 0452  DDIMER 2.68*  --  2.54* 6.60*  CRP  --  6.2* 3.3* 2.1*    Lab Results  Component Value Date   SARSCOV2NAA POSITIVE (A) 03/08/2020    CBC: Recent Labs  Lab 03/09/20 0630 03/10/20 0601 03/11/20 1000 03/12/20 0440 03/13/20 0452  WBC 12.2* 20.3* 12.9* 9.8 11.5*  NEUTROABS 11.3* 18.4* 10.9* 6.2 8.1*  HGB 12.9 11.3* 11.7* 11.8* 12.0  HCT 40.4 34.9* 36.4 36.9 37.0  MCV 92.9 92.1 93.8 94.1 93.4  PLT 253 288 294 280 302    Cardiac Enzymes: No results for input(s): CKTOTAL, CKMB, CKMBINDEX, TROPONINI in the last 168 hours. BNP (last 3 results) No results for input(s):  PROBNP in the last 8760 hours. CBG: Recent Labs  Lab 03/08/20 2314 03/09/20 0836 03/09/20 1749 03/09/20 2052 03/10/20 0739  GLUCAP 209* 168* 241* 218* 162*   D-Dimer: Recent Labs    03/12/20 0440 03/13/20 0452  DDIMER 2.54* 6.60*   Hgb A1c: No results for input(s): HGBA1C in the last 72 hours. Lipid Profile: No results for input(s): CHOL, HDL, LDLCALC, TRIG, CHOLHDL, LDLDIRECT in the last 72 hours. Thyroid function studies: No results for input(s): TSH, T4TOTAL, T3FREE, THYROIDAB in the last 72 hours.  Invalid input(s): FREET3 Anemia work up: No results for input(s): VITAMINB12, FOLATE, FERRITIN, TIBC, IRON, RETICCTPCT in the last 72 hours. Sepsis Labs: Recent Labs  Lab 03/08/20 1630 03/08/20 1855 03/09/20 0630 03/10/20 0601 03/11/20 0837 03/11/20 1000 03/12/20 0440 03/13/20 0452  PROCALCITON 0.50  --   --   --  0.26  --  <0.10  --   WBC 16.4*  --    < > 20.3*  --  12.9* 9.8 11.5*  LATICACIDVEN 1.9 0.8  --   --   --   --   --   --    < > = values in this interval not displayed.   Microbiology Recent Results (from the past 240 hour(s))  Resp Panel by RT-PCR (Flu A&B, Covid) Nasopharyngeal Swab     Status: Abnormal   Collection Time: 03/08/20  4:14 PM   Specimen: Nasopharyngeal Swab; Nasopharyngeal(NP) swabs in vial transport medium  Result Value Ref Range Status   SARS Coronavirus 2 by RT PCR POSITIVE (A) NEGATIVE Final    Comment: RESULT CALLED TO, READ BACK BY AND VERIFIED WITH: DR. Darl Householder AT 8768 ON 03/08/20 BY N.THOMPSON (NOTE) SARS-CoV-2 target nucleic acids are DETECTED.  The SARS-CoV-2 RNA is generally detectable in upper respiratory specimens during the acute phase of infection. Positive results are indicative of the presence of the identified virus, but do not rule out bacterial infection or  co-infection with other pathogens not detected by the test. Clinical correlation with patient history and other diagnostic information is necessary to determine patient infection status. The expected result is Negative.  Fact Sheet for Patients: EntrepreneurPulse.com.au  Fact Sheet for Healthcare Providers: IncredibleEmployment.be  This test is not yet approved or cleared by the [place] FDA and  has been authorized for detection and/or diagnosis of SARS-CoV-2 by FDA under an Emergency Use Authorization (EUA).  This EUA will remain in effect (meaning this test  can be used) for the duration of  the COVID-19 declaration under Section 564(b)(1) of the Act, 21 U.S.C. section 360bbb-3(b)(1), unless the authorization is terminated or revoked sooner.     Influenza A by PCR NEGATIVE NEGATIVE Final   Influenza B by PCR NEGATIVE NEGATIVE Final    Comment: (NOTE) The Xpert Xpress SARS-CoV-2/FLU/RSV plus assay is intended as an aid in the diagnosis of influenza from Nasopharyngeal swab specimens and should not be used as a sole basis for treatment. Nasal washings and aspirates are unacceptable for Xpert Xpress SARS-CoV-2/FLU/RSV testing.  Fact Sheet for Patients: EntrepreneurPulse.com.au  Fact Sheet for Healthcare Providers: IncredibleEmployment.be  This test is not yet approved or cleared by the [place] FDA and has been authorized for detection and/or diagnosis of SARS-CoV-2 by FDA under an Emergency Use Authorization (EUA). This EUA will remain in effect (meaning this test can be used) for the duration of the COVID-19 declaration under Section 564(b)(1) of the Act, 21 U.S.C. section 360bbb-3(b)(1), unless the authorization is terminated or revoked.  Performed at  [facility], [place] [address], [place], [place] [address]   Blood Culture (routine x 2)     Status: None (Preliminary  result)   Collection Time: 03/08/20  4:20 PM   Specimen: BLOOD  Result Value Ref Range Status   Specimen Description   Final    BLOOD RIGHT ANTECUBITAL Performed at [facility] [address] [place], [place] [address]    Special Requests   Final    BOTTLES DRAWN AEROBIC AND ANAEROBIC Blood Culture results may not be optimal due to an inadequate volume of blood received in culture bottles Performed at [facility] [address], [place], [place] [address]    Culture   Final    NO GROWTH 4 DAYS Performed at [facility], [place] [address], [place], [place] [address]    Report Status PENDING  Incomplete  Blood Culture (routine x 2)     Status: None (Preliminary result)   Collection Time: 03/08/20  4:30 PM   Specimen: BLOOD  Result Value Ref Range Status   Specimen Description   Final    BLOOD RIGHT WRIST Performed at [facility] [address] [place], [place] [address]    Special Requests   Final    BOTTLES DRAWN AEROBIC AND ANAEROBIC Blood Culture adequate volume Performed at [facility] [address] [place], [place] [address]    Culture   Final    NO GROWTH 4 DAYS Performed at [facility], [place] [address], [place], [place] [address]    Report Status PENDING  Incomplete  MRSA PCR Screening     Status: None   Collection Time: 03/09/20  2:28 PM   Specimen: Nasal Mucosa; Nasopharyngeal  Result Value Ref Range Status   MRSA by PCR NEGATIVE NEGATIVE Final    Comment:        The GeneXpert MRSA Assay (FDA approved for NASAL specimens only), is one component of a comprehensive MRSA colonization surveillance program. It is not intended to diagnose MRSA infection nor to guide or monitor treatment for MRSA infections. Performed at [facility], [place] [address] [place], [place] [address]      Medications:   . enoxaparin (LOVENOX) injection  40 mg  Subcutaneous Q24H  . FLUoxetine  20 mg Oral Daily  . levothyroxine  50 mcg Oral Q0600  . lithium carbonate  300 mg Oral Daily  . predniSONE  50 mg Oral Daily   Continuous Infusions:     LOS: 5 days   Charlynne Cousins  [facility]  03/13/2020, 7:27 AM

## 2020-03-13 NOTE — TOC Progression Note (Addendum)
Transition of Care [facility]) - Progression Note    Patient Details  Name: Laura Drake MRN: [id number] Date of Birth: 1940-06-23  [facility]) CM/SW Contact  Purcell Mouton, RN Phone Number: 03/13/2020, 9:16 AM  Clinical Narrative:     A call was made to pt's daughter Almyra Free, to confirm [facility]. VM was left.   Expected Discharge Plan: [place] Barriers to Discharge: No Barriers Identified  Expected Discharge Plan and Services Expected Discharge Plan: [place] arrangements for the past 2 months: Single Family Home Expected Discharge Date: 03/13/20                                     Social Determinants of Health (SDOH) Interventions    Readmission Risk Interventions No flowsheet data found.

## 2020-03-13 NOTE — TOC Progression Note (Signed)
Transition of Care [facility]) - Progression Note    Patient Details  Name: Laura Drake MRN: [id number] Date of Birth: 1940/06/04  [facility] CM/SW Contact  Purcell Mouton, RN Phone Number: 03/13/2020, 1:01 PM  Clinical Narrative:    Unsafe discharge related to no one being in the home until in the AM to care for pt. Spoke with pt's sister Mechele Claude will pick pt up in the AM. Almyra Free is an Therapist, sports in a [employer] unable to answer telephone and pt's son who lives with her is out of town.    Expected Discharge Plan: [place] Barriers to Discharge: No Barriers Identified  Expected Discharge Plan and Services Expected Discharge Plan: [place] arrangements for the past 2 months: Single Family Home Expected Discharge Date: 03/13/20                                     Social Determinants of Health (SDOH) Interventions    Readmission Risk Interventions No flowsheet data found.

## 2020-03-13 NOTE — Discharge Summary (Signed)
Physician Discharge Summary  Laura Drake XBM:[id number] DOB: [birth year] DOA: 03/08/2020  PCP: Merryl Hacker, No  Admit date: 03/08/2020 Discharge date: 03/13/2020  Admitted From: Home Disposition:  Home  Recommendations for Outpatient Follow-up:  1. Follow up with PCP in 1-2 weeks 2. Please obtain BMP/CBC in one week   Home Health:Yes Equipment/Devices:oxygen 3L  Discharge Condition:Stable CODE STATUS: DNR Diet recommendation: Heart Healthy   Brief/Interim Summary: 79 y.o. female recently discharged from the hospital on 4 L in November due to COVID-19 returns on 05/10/2019 hypoxic requiring 2 L of high flow nasal cannula CT angio revealed bilateral diffuse infiltrates with no evidence of PE CRP was 31 white count was 16 (she had been on steroids) D-dimer 4.7 due to suspicious to superimposed pneumonia she was started on IV Vanco and cefepime, her family declined remdesivir  Discharge Diagnoses:  Principal Problem:   Acute respiratory distress syndrome (ARDS) due to COVID-19 virus [facility]) Active Problems:   Bipolar affective disorder ([place])   Bacterial pneumonia   Acute respiratory failure with hypoxia (HCC)   Hyponatremia   Dementia without behavioral disturbance ([place])  Acute respiratory failure due to COVID-19 possibly superimposed pneumonia: She was admitted to the hospital started on IV Rocephin and azithromycin culture data remain negative her leukocytosis resolved she remained afebrile she was still requiring oxygen on admission she was requiring 10 L she has been brought down to 2 to 3 L. She will go home with oxygen supplementation. Physical therapy evaluated the patient the recommended home health PT she refused skilled nursing facility rehab.  Elevated D-dimer: CT angio of the chest showed no PE lower extremity Doppler was negative for PE. She was treated with Lovenox prophylactic dose while in-house.  Bipolar disorder: Continue lithium and SSRI.  Steroid-induced  hyperglycemia: With an A1c of 6.3 she was started on sliding scale insulin insulin. She will go home on a prednisone taper.  Hypothyroidism: Continue Synthroid.  Hypovolemic hyponatremia: Resolved with IV fluid hydration.  Discharge Instructions  Discharge Instructions    Diet - low sodium heart healthy   Complete by: As directed    Increase activity slowly   Complete by: As directed      Allergies as of 03/13/2020      Reactions   Amitiza [lubiprostone] Diarrhea, Nausea And Vomiting, Other (See Comments)   Bad pains   Other Swelling   Shellfish   Sulfa Antibiotics    Tramadol Rash      Medication List    TAKE these medications   Euthyrox 50 MCG tablet Generic drug: levothyroxine Take 50 mcg by mouth daily.   FLUoxetine 20 MG capsule Commonly known as: PROZAC Take 20 mg by mouth daily.   Lithobid 300 MG CR tablet Generic drug: lithium carbonate Take 300 mg by mouth daily.   predniSONE 10 MG tablet Commonly known as: DELTASONE TAke 4 tablets for 1 days, then 3 tablets for 1 days, then 2 tabs for 1 days, then 1 tab for 1 days, and then stop.   psyllium 95 % Pack Commonly known as: HYDROCIL/METAMUCIL Take 1 packet by mouth daily for 5 days.       Allergies  Allergen Reactions  . Amitiza [Lubiprostone] Diarrhea, Nausea And Vomiting and Other (See Comments)    Bad pains  . Other Swelling    Shellfish  . Sulfa Antibiotics   . Tramadol Rash    Consultations:  None   Procedures/Studies: CT Angio Chest PE W and/or Wo Contrast  Result Date: 03/08/2020 CLINICAL DATA:  Shortness of breath EXAM: CT ANGIOGRAPHY CHEST WITH CONTRAST TECHNIQUE: Multidetector CT imaging of the chest was performed using the standard protocol during bolus administration of intravenous contrast. Multiplanar CT image reconstructions and MIPs were obtained to evaluate the vascular anatomy. CONTRAST:  159mL OMNIPAQUE IOHEXOL 350 MG/ML SOLN COMPARISON:  10/14/2011 FINDINGS:  Cardiovascular: Contrast injection is sufficient to demonstrate satisfactory opacification of the pulmonary arteries to the segmental level. There is no pulmonary embolus or evidence of right heart strain. The size of the main pulmonary artery is normal. Heart size is normal, with no pericardial effusion. The course and caliber of the aorta are normal. There is mild atherosclerotic calcification. Opacification decreased due to pulmonary arterial phase contrast bolus timing. Mediastinum/Nodes: No mediastinal, hilar or axillary lymphadenopathy. Normal visualized thyroid. Lungs/Pleura: Multifocal, peripheral predominant ground glass opacities. No pleural effusion. Upper Abdomen: Contrast bolus timing is not optimized for evaluation of the abdominal organs. The visualized portions of the organs of the upper abdomen are normal. Musculoskeletal: No chest wall abnormality. No bony spinal canal stenosis. Review of the MIP images confirms the above findings. IMPRESSION: 1. No pulmonary embolus or acute aortic syndrome. 2. Multifocal, peripheral predominant ground glass opacities, most consistent with multifocal pneumonia. Aortic Atherosclerosis (ICD10-I70.0). Electronically Signed   By: Ulyses Jarred M.D.   On: 03/08/2020 20:25   DG Chest Port 1 View  Result Date: 03/08/2020 CLINICAL DATA:  Shortness of breath. EXAM: PORTABLE CHEST 1 VIEW COMPARISON:  CT chest 10/15/2011 FINDINGS: Cardiac enlargement. Decreased lung volumes. Diffuse increase interstitial and airspace opacities within the left upper lobe and left lower lobe are identified compatible with multifocal pneumonia. IMPRESSION: Multifocal interstitial and airspace densities within the left lung compatible with multifocal pneumonia. Electronically Signed   By: Kerby Moors M.D.   On: 03/08/2020 17:37   VAS [place] LOWER EXTREMITY VENOUS (DVT)  Result Date: 03/10/2020  Lower Venous DVT Study Indications: Elevated ddimer.  Comparison Study: no prior Performing  Technologist: Abram Sander RVS  Examination Guidelines: A complete evaluation includes B-mode imaging, spectral Doppler, color Doppler, and power Doppler as needed of all accessible portions of each vessel. Bilateral testing is considered an integral part of a complete examination. Limited examinations for reoccurring indications may be performed as noted. The reflux portion of the exam is performed with the patient in reverse Trendelenburg.  +---------+---------------+---------+-----------+----------+--------------+ RIGHT    CompressibilityPhasicitySpontaneityPropertiesThrombus Aging +---------+---------------+---------+-----------+----------+--------------+ CFV      Full           Yes      Yes                                 +---------+---------------+---------+-----------+----------+--------------+ SFJ      Full                                                        +---------+---------------+---------+-----------+----------+--------------+ FV Prox  Full                                                        +---------+---------------+---------+-----------+----------+--------------+ FV Mid   Full                                                        +---------+---------------+---------+-----------+----------+--------------+  FV DistalFull                                                        +---------+---------------+---------+-----------+----------+--------------+ PFV      Full                                                        +---------+---------------+---------+-----------+----------+--------------+ POP      Full           Yes      Yes                                 +---------+---------------+---------+-----------+----------+--------------+ PTV      Full                                                        +---------+---------------+---------+-----------+----------+--------------+ PERO     Full                                                         +---------+---------------+---------+-----------+----------+--------------+   +---------+---------------+---------+-----------+----------+--------------+ LEFT     CompressibilityPhasicitySpontaneityPropertiesThrombus Aging +---------+---------------+---------+-----------+----------+--------------+ CFV      Full           Yes      Yes                                 +---------+---------------+---------+-----------+----------+--------------+ SFJ      Full                                                        +---------+---------------+---------+-----------+----------+--------------+ FV Prox  Full                                                        +---------+---------------+---------+-----------+----------+--------------+ FV Mid   Full                                                        +---------+---------------+---------+-----------+----------+--------------+ FV DistalFull                                                        +---------+---------------+---------+-----------+----------+--------------+  PFV      Full                                                        +---------+---------------+---------+-----------+----------+--------------+ POP      Full           Yes      Yes                                 +---------+---------------+---------+-----------+----------+--------------+ PTV      Full                                                        +---------+---------------+---------+-----------+----------+--------------+ PERO     Full                                                        +---------+---------------+---------+-----------+----------+--------------+     Summary: BILATERAL: - No evidence of deep vein thrombosis seen in the lower extremities, bilaterally. - No evidence of superficial venous thrombosis in the lower extremities, bilaterally. -No evidence of popliteal cyst, bilaterally.   *See table(s) above  for measurements and observations. Electronically signed by Harold Barban MD on 03/10/2020 at 5:35:16 PM.    Final     (Echo, Carotid, EGD, Colonoscopy, ERCP)    Subjective: No new complaints she relates she is feels great this morning.  Discharge Exam: Vitals:   03/12/20 2121 03/13/20 0540  BP: 126/69 (!) 125/99  Pulse: 73 70  Resp: 16 16  Temp: 98.4 F (36.9 C) 97.8 F (36.6 C)  SpO2: 95% 96%   Vitals:   03/12/20 0511 03/12/20 1234 03/12/20 2121 03/13/20 0540  BP: 125/69 128/62 126/69 (!) 125/99  Pulse: 68 70 73 70  Resp: 18 16 16 16   Temp: 97.8 F (36.6 C) 98.1 F (36.7 C) 98.4 F (36.9 C) 97.8 F (36.6 C)  TempSrc: Oral Oral Oral Oral  SpO2: 95% 92% 95% 96%  Weight:      Height:        General: Pt is alert, awake, not in acute distress Cardiovascular: RRR, S1/S2 +, no rubs, no gallops Respiratory: CTA bilaterally, no wheezing, no rhonchi Abdominal: Soft, NT, ND, bowel sounds + Extremities: no edema, no cyanosis    The results of significant diagnostics from this hospitalization (including imaging, microbiology, ancillary and laboratory) are listed below for reference.     Microbiology: Recent Results (from the past 240 hour(s))  Resp Panel by RT-PCR (Flu A&B, Covid) Nasopharyngeal Swab     Status: Abnormal   Collection Time: 03/08/20  4:14 PM   Specimen: Nasopharyngeal Swab; Nasopharyngeal(NP) swabs in vial transport medium  Result Value Ref Range Status   SARS Coronavirus 2 by RT PCR POSITIVE (A) NEGATIVE Final    Comment: RESULT CALLED TO, READ BACK BY AND VERIFIED WITH: DR. Darl Householder AT 0626 ON 03/08/20 BY N.THOMPSON (NOTE) SARS-CoV-2 target nucleic acids are DETECTED.  The SARS-CoV-2 RNA  is generally detectable in upper respiratory specimens during the acute phase of infection. Positive results are indicative of the presence of the identified virus, but do not rule out bacterial infection or co-infection with other pathogens not detected by the test.  Clinical correlation with patient history and other diagnostic information is necessary to determine patient infection status. The expected result is Negative.  Fact Sheet for Patients: EntrepreneurPulse.com.au  Fact Sheet for Healthcare Providers: IncredibleEmployment.be  This test is not yet approved or cleared by the [place] FDA and  has been authorized for detection and/or diagnosis of SARS-CoV-2 by FDA under an Emergency Use Authorization (EUA).  This EUA will remain in effect (meaning this test  can be used) for the duration of  the COVID-19 declaration under Section 564(b)(1) of the Act, 21 U.S.C. section 360bbb-3(b)(1), unless the authorization is terminated or revoked sooner.     Influenza A by PCR NEGATIVE NEGATIVE Final   Influenza B by PCR NEGATIVE NEGATIVE Final    Comment: (NOTE) The Xpert Xpress SARS-CoV-2/FLU/RSV plus assay is intended as an aid in the diagnosis of influenza from Nasopharyngeal swab specimens and should not be used as a sole basis for treatment. Nasal washings and aspirates are unacceptable for Xpert Xpress SARS-CoV-2/FLU/RSV testing.  Fact Sheet for Patients: EntrepreneurPulse.com.au  Fact Sheet for Healthcare Providers: IncredibleEmployment.be  This test is not yet approved or cleared by the [place] FDA and has been authorized for detection and/or diagnosis of SARS-CoV-2 by FDA under an Emergency Use Authorization (EUA). This EUA will remain in effect (meaning this test can be used) for the duration of the COVID-19 declaration under Section 564(b)(1) of the Act, 21 U.S.C. section 360bbb-3(b)(1), unless the authorization is terminated or revoked.  Performed at [facility], [place] [address] [place], [place] [address]   Blood Culture (routine x 2)     Status: None (Preliminary result)   Collection Time: 03/08/20  4:20 PM   Specimen: BLOOD   Result Value Ref Range Status   Specimen Description   Final    BLOOD RIGHT ANTECUBITAL Performed at [place] [address] [place], [place] [address]    Special Requests   Final    BOTTLES DRAWN AEROBIC AND ANAEROBIC Blood Culture results may not be optimal due to an inadequate volume of blood received in culture bottles Performed at [place] [address], [place], [place] [address]    Culture   Final    NO GROWTH 4 DAYS Performed at [facility], [place] [address], [place], [place] [address]    Report Status PENDING  Incomplete  Blood Culture (routine x 2)     Status: None (Preliminary result)   Collection Time: 03/08/20  4:30 PM   Specimen: BLOOD  Result Value Ref Range Status   Specimen Description   Final    BLOOD RIGHT WRIST Performed at [facility] [address], [place], [place] [address]    Special Requests   Final    BOTTLES DRAWN AEROBIC AND ANAEROBIC Blood Culture adequate volume Performed at [place] [address] [place], [place] [address]    Culture   Final    NO GROWTH 4 DAYS Performed at [facility], [place] [address], [place], [place] [address]    Report Status PENDING  Incomplete  MRSA PCR Screening     Status: None   Collection Time: 03/09/20  2:28 PM   Specimen:  Nasal Mucosa; Nasopharyngeal  Result Value Ref Range Status   MRSA by PCR NEGATIVE NEGATIVE Final    Comment:        The GeneXpert MRSA Assay (FDA approved for NASAL specimens only), is one component of a comprehensive MRSA colonization surveillance program. It is not intended to diagnose MRSA infection nor to guide or monitor treatment for MRSA infections. Performed at [facility], [place] [address], [place], [place] [address]      Labs: BNP (last 3 results) Recent Labs    03/08/20 1630  BNP 277.4*   Basic Metabolic Panel: Recent Labs  Lab 03/09/20 0630  03/10/20 0601 03/11/20 1000 03/12/20 0440 03/13/20 0452  NA 133* 133* 136 139 136  K 4.6 4.1 4.5 4.4 4.0  CL 101 101 106 107 105  CO2 22 22 21* 22 25  GLUCOSE 205* 186* 112* 87 102*  BUN 19 31* 30* 30* 24*  CREATININE 0.93 1.01* 0.90 0.84 0.79  CALCIUM 9.3 9.3 9.1 9.2 8.9   Liver Function Tests: Recent Labs  Lab 03/09/20 0630 03/10/20 0601 03/11/20 1000 03/12/20 0440 03/13/20 0452  AST 23 21 19 28 28   ALT 41 33 26 30 34  ALKPHOS 72 71 61 60 55  BILITOT 0.4 0.6 0.4 0.4 0.6  PROT 6.7 6.5 6.0* 6.0* 5.8*  ALBUMIN 2.6* 2.5* 2.4* 2.6* 2.7*   No results for input(s): LIPASE, AMYLASE in the last 168 hours. No results for input(s): AMMONIA in the last 168 hours. CBC: Recent Labs  Lab 03/09/20 0630 03/10/20 0601 03/11/20 1000 03/12/20 0440 03/13/20 0452  WBC 12.2* 20.3* 12.9* 9.8 11.5*  NEUTROABS 11.3* 18.4* 10.9* 6.2 8.1*  HGB 12.9 11.3* 11.7* 11.8* 12.0  HCT 40.4 34.9* 36.4 36.9 37.0  MCV 92.9 92.1 93.8 94.1 93.4  PLT 253 288 294 280 302   Cardiac Enzymes: No results for input(s): CKTOTAL, CKMB, CKMBINDEX, TROPONINI in the last 168 hours. BNP: Invalid input(s): POCBNP CBG: Recent Labs  Lab 03/08/20 2314 03/09/20 0836 03/09/20 1749 03/09/20 2052 03/10/20 0739  GLUCAP 209* 168* 241* 218* 162*   D-Dimer Recent Labs    03/12/20 0440 03/13/20 0452  DDIMER 2.54* 6.60*   Hgb A1c No results for input(s): HGBA1C in the last 72 hours. Lipid Profile No results for input(s): CHOL, HDL, LDLCALC, TRIG, CHOLHDL, LDLDIRECT in the last 72 hours. Thyroid function studies No results for input(s): TSH, T4TOTAL, T3FREE, THYROIDAB in the last 72 hours.  Invalid input(s): FREET3 Anemia work up No results for input(s): VITAMINB12, FOLATE, FERRITIN, TIBC, IRON, RETICCTPCT in the last 72 hours. Urinalysis    Component Value Date/Time   COLORURINE YELLOW 10/14/2011 2221   APPEARANCEUR TURBID (A) 10/14/2011 2221   LABSPEC >1.046 (H) 10/14/2011 2221   PHURINE 6.0  10/14/2011 2221   GLUCOSEU NEGATIVE 10/14/2011 2221   HGBUR NEGATIVE 10/14/2011 2221   BILIRUBINUR Neg 12/11/2011 1044   KETONESUR NEGATIVE 10/14/2011 2221   PROTEINUR Neg 12/11/2011 1044   PROTEINUR NEGATIVE 10/14/2011 2221   UROBILINOGEN 0.2 12/11/2011 1044   UROBILINOGEN 1.0 10/14/2011 2221   NITRITE Neg 12/11/2011 1044   NITRITE NEGATIVE 10/14/2011 2221   LEUKOCYTESUR moderate (2+) 12/11/2011 1044   Sepsis Labs Invalid input(s): PROCALCITONIN,  WBC,  LACTICIDVEN Microbiology Recent Results (from the past 240 hour(s))  Resp Panel by RT-PCR (Flu A&B, Covid) Nasopharyngeal Swab     Status: Abnormal   Collection Time: 03/08/20  4:14 PM   Specimen: Nasopharyngeal Swab; Nasopharyngeal(NP) swabs in vial transport medium  Result  Value Ref Range Status   SARS Coronavirus 2 by RT PCR POSITIVE (A) NEGATIVE Final    Comment: RESULT CALLED TO, READ BACK BY AND VERIFIED WITH: DR. Darl Householder AT 4650 ON 03/08/20 BY N.THOMPSON (NOTE) SARS-CoV-2 target nucleic acids are DETECTED.  The SARS-CoV-2 RNA is generally detectable in upper respiratory specimens during the acute phase of infection. Positive results are indicative of the presence of the identified virus, but do not rule out bacterial infection or co-infection with other pathogens not detected by the test. Clinical correlation with patient history and other diagnostic information is necessary to determine patient infection status. The expected result is Negative.  Fact Sheet for Patients: EntrepreneurPulse.com.au  Fact Sheet for Healthcare Providers: IncredibleEmployment.be  This test is not yet approved or cleared by the [place] FDA and  has been authorized for detection and/or diagnosis of SARS-CoV-2 by FDA under an Emergency Use Authorization (EUA).  This EUA will remain in effect (meaning this test  can be used) for the duration of  the COVID-19 declaration under Section 564(b)(1) of the Act,  21 U.S.C. section 360bbb-3(b)(1), unless the authorization is terminated or revoked sooner.     Influenza A by PCR NEGATIVE NEGATIVE Final   Influenza B by PCR NEGATIVE NEGATIVE Final    Comment: (NOTE) The Xpert Xpress SARS-CoV-2/FLU/RSV plus assay is intended as an aid in the diagnosis of influenza from Nasopharyngeal swab specimens and should not be used as a sole basis for treatment. Nasal washings and aspirates are unacceptable for Xpert Xpress SARS-CoV-2/FLU/RSV testing.  Fact Sheet for Patients: EntrepreneurPulse.com.au  Fact Sheet for Healthcare Providers: IncredibleEmployment.be  This test is not yet approved or cleared by the [place] FDA and has been authorized for detection and/or diagnosis of SARS-CoV-2 by FDA under an Emergency Use Authorization (EUA). This EUA will remain in effect (meaning this test can be used) for the duration of the COVID-19 declaration under Section 564(b)(1) of the Act, 21 U.S.C. section 360bbb-3(b)(1), unless the authorization is terminated or revoked.  Performed at [facility], [place] [address], [place], [place] [address]   Blood Culture (routine x 2)     Status: None (Preliminary result)   Collection Time: 03/08/20  4:20 PM   Specimen: BLOOD  Result Value Ref Range Status   Specimen Description   Final    BLOOD RIGHT ANTECUBITAL Performed at [facility] [address], [place], [place] [address]    Special Requests   Final    BOTTLES DRAWN AEROBIC AND ANAEROBIC Blood Culture results may not be optimal due to an inadequate volume of blood received in culture bottles Performed at [place] [address] [place], [place] [address]    Culture   Final    NO GROWTH 4 DAYS Performed at [facility], [place] [address], [place], [place] [address]    Report Status PENDING  Incomplete  Blood Culture (routine x 2)     Status: None  (Preliminary result)   Collection Time: 03/08/20  4:30 PM   Specimen: BLOOD  Result Value Ref Range Status   Specimen Description   Final    BLOOD RIGHT WRIST Performed at [facility] [address] [place], [place] [address]    Special Requests   Final    BOTTLES DRAWN AEROBIC AND ANAEROBIC Blood Culture adequate volume Performed at [facility] [address] [place],  [address]    Culture   Final  NO GROWTH 4 DAYS Performed at [facility], [place] [address], [place], [place] [address]    Report Status PENDING  Incomplete  MRSA PCR Screening     Status: None   Collection Time: 03/09/20  2:28 PM   Specimen: Nasal Mucosa; Nasopharyngeal  Result Value Ref Range Status   MRSA by PCR NEGATIVE NEGATIVE Final    Comment:        The GeneXpert MRSA Assay (FDA approved for NASAL specimens only), is one component of a comprehensive MRSA colonization surveillance program. It is not intended to diagnose MRSA infection nor to guide or monitor treatment for MRSA infections. Performed at [facility], [place] [address] [place], [place] [address]      Time coordinating discharge: Over 30 minutes  SIGNED:   Charlynne Cousins, MD  [facility] 03/13/2020, 8:00 AM Pager   If 7PM-7AM, please contact night-coverage [email or web address] Password [email or web address]

## 2020-03-13 NOTE — Progress Notes (Signed)
RN attempted to call pt's son Laura Drake with no response. RN called daughter, Laura Drake, whom answered stating she was sorry she could not answer whomever called before because she is at work. This RN explained that pt is ready for discharge per MD. Laura Drake reports that the patient cannot be discharged today because her brother is in [place] and they would need to get 24 hour care arranged for her as "she cannot be alone." This RN reported that I would have Cookie, TOC RN to call her back to discuss. Laura Drake reported to have Cookie call her sister Laura Drake because "she has her own business and she can answer the phone." This RN notified Cookie, [facility] RN.

## 2020-03-13 NOTE — Care Management Important Message (Signed)
Important Message  Patient Details IM Letter given to the Patient. Name: Etana Beets MRN: [id number] Date of Birth: [birth year]   Medicare Important Message Given:  Yes     Kerin Salen 03/13/2020, 1:39 PM

## 2020-03-13 NOTE — TOC Progression Note (Signed)
Transition of Care [facility]) - Progression Note    Patient Details  Name: Laura Drake MRN: [id number] Date of Birth: [birth year]  Transition of Care [facility] CM/SW Contact  Purcell Mouton, RN Phone Number: 03/13/2020, 10:43 AM  Clinical Narrative:    A call to pt's sister Almyra Free and brother Suezanne Jacquet was made with no answer. A call to Asa Lente was made who states that her sister Almyra Free is an Therapist, sports, can't answer her phone and her brother can't hear. Waiting for a call back from [place] before pt can discharge home related to no one being at pt's home. Asa Lente will have transportation arranged and pt's oxygen tank for discharge home.     Expected Discharge Plan: [place] Barriers to Discharge: No Barriers Identified  Expected Discharge Plan and Services Expected Discharge Plan: [facility] arrangements for the past 2 months: Single Family Home Expected Discharge Date: 03/13/20                                     Social Determinants of Health (SDOH) Interventions    Readmission Risk Interventions No flowsheet data found.

## 2020-03-14 ENCOUNTER — Encounter (HOSPITAL_COMMUNITY): Payer: Medicare Other

## 2020-03-14 ENCOUNTER — Inpatient Hospital Stay (HOSPITAL_COMMUNITY): Payer: Medicare Other

## 2020-03-14 NOTE — Progress Notes (Signed)
PROGRESS NOTE  Subjective: Patient discharged yesterday, though logistically needed to stay overnight. D-dimer checked this morning is elevated from prior. On my evaluation this morning, patient feels well, ready to go home, denies any trouble breathing, chest pain, leg swelling or pain. Declines any further work up, continues to feel her breathing is better every day. Wants to go home this morning.  Objective: BP 137/69 (BP Location: Right Arm)   Pulse 67   Temp 97.8 F (36.6 C) (Oral)   Resp 18   Ht 5' 2.99" (1.6 m)   Wt 82.3 kg   SpO2 93%   BMI 32.15 kg/m   General: Pt is alert, awake, not in acute distress Cardiovascular: RRR, S1/S2 +, no rubs, no gallops Respiratory: Nonlabored, diminished with improved crackles. Abdominal: Soft, NT, ND, bowel sounds + Extremities: No edema, no cyanosis  Assessment & Plan: The patient was discharged yesterday with home health services, on 3L O2. She has everything in place today, awaiting her daughter's arrival to go back home. She declines any work up for rising d-dimer today, though also denies any symptoms of VTE and has no vital sign or exam changes that would suggest new DVT/PE. Return precautions were given. Please see discharge summary from 03/13/2020 which remains accurate.   Laura Pour, MD Pager on amion 03/14/2020, 9:55 AM

## 2020-03-14 NOTE — Progress Notes (Signed)
0830 The patient is refusing the bilateral lower extremity venous duplex. She states that she just recently had the test performed and does not want it repeated at this time. She further states that she is eager for discharge and remarks that her daughter will be here to pick her up soon.  Dr. Bonner Puna was informed of the patient's refusal.  03/14/20 8:43 AM Carlos Levering RVT

## 2020-03-14 NOTE — Progress Notes (Signed)
Patient discharging home with daughter.  IV removed - WNL.  Reviewed AVS and new medications - patient verbalizes understanding.  No questions at this time.  Daughter Almyra Free called, states she will pick patient up soon on her lunch break and will bring patient portable O2.  Patient in NAD at this time, waiting arrival of daughter

## 2020-03-14 NOTE — Discharge Instructions (Signed)
COVID-19 COVID-19 is a respiratory infection that is caused by a virus called severe acute respiratory syndrome coronavirus 2 (SARS-CoV-2). The disease is also known as coronavirus disease or novel coronavirus. In some people, the virus may not cause any symptoms. In others, it may cause a serious infection. The infection can get worse quickly and can lead to complications, such as:  Pneumonia, or infection of the lungs.  Acute respiratory distress syndrome or ARDS. This is a condition in which fluid build-up in the lungs prevents the lungs from filling with air and passing oxygen into the blood.  Acute respiratory failure. This is a condition in which there is not enough oxygen passing from the lungs to the body or when carbon dioxide is not passing from the lungs out of the body.  Sepsis or septic shock. This is a serious bodily reaction to an infection.  Blood clotting problems.  Secondary infections due to bacteria or fungus.  Organ failure. This is when your body's organs stop working. The virus that causes COVID-19 is contagious. This means that it can spread from person to person through droplets from coughs and sneezes (respiratory secretions). What are the causes? This illness is caused by a virus. You may catch the virus by:  Breathing in droplets from an infected person. Droplets can be spread by a person breathing, speaking, singing, coughing, or sneezing.  Touching something, like a table or a doorknob, that was exposed to the virus (contaminated) and then touching your mouth, nose, or eyes. What increases the risk? Risk for infection You are more likely to be infected with this virus if you:  Are within 6 feet (2 meters) of a person with COVID-19.  Provide care for or live with a person who is infected with COVID-19.  Spend time in crowded indoor spaces or live in shared housing. Risk for serious illness You are more likely to become seriously ill from the virus if you:   Are 50 years of age or older. The higher your age, the more you are at risk for serious illness.  Live in a nursing home or long-term care facility.  Have cancer.  Have a long-term (chronic) disease such as: ? Chronic lung disease, including chronic obstructive pulmonary disease or asthma. ? A long-term disease that lowers your body's ability to fight infection (immunocompromised). ? Heart disease, including heart failure, a condition in which the arteries that lead to the heart become narrow or blocked (coronary artery disease), a disease which makes the heart muscle thick, weak, or stiff (cardiomyopathy). ? Diabetes. ? Chronic kidney disease. ? Sickle cell disease, a condition in which red blood cells have an abnormal "sickle" shape. ? Liver disease.  Are obese. What are the signs or symptoms? Symptoms of this condition can range from mild to severe. Symptoms may appear any time from 2 to 14 days after being exposed to the virus. They include:  A fever or chills.  A cough.  Difficulty breathing.  Headaches, body aches, or muscle aches.  Runny or stuffy (congested) nose.  A sore throat.  New loss of taste or smell. Some people may also have stomach problems, such as nausea, vomiting, or diarrhea. Other people may not have any symptoms of COVID-19. How is this diagnosed? This condition may be diagnosed based on:  Your signs and symptoms, especially if: ? You live in an area with a COVID-19 outbreak. ? You recently traveled to or from an area where the virus is common. ? You   provide care for or live with a person who was diagnosed with COVID-19. ? You were exposed to a person who was diagnosed with COVID-19.  A physical exam.  Lab tests, which may include: ? Taking a sample of fluid from the back of your nose and throat (nasopharyngeal fluid), your nose, or your throat using a swab. ? A sample of mucus from your lungs (sputum). ? Blood tests.  Imaging tests, which  may include, X-rays, CT scan, or ultrasound. How is this treated? At present, there is no medicine to treat COVID-19. Medicines that treat other diseases are being used on a trial basis to see if they are effective against COVID-19. Your health care provider will talk with you about ways to treat your symptoms. For most people, the infection is mild and can be managed at home with rest, fluids, and over-the-counter medicines. Treatment for a serious infection usually takes places in a hospital intensive care unit (ICU). It may include one or more of the following treatments. These treatments are given until your symptoms improve.  Receiving fluids and medicines through an IV.  Supplemental oxygen. Extra oxygen is given through a tube in the nose, a face mask, or a hood.  Positioning you to lie on your stomach (prone position). This makes it easier for oxygen to get into the lungs.  Continuous positive airway pressure (CPAP) or bi-level positive airway pressure (BPAP) machine. This treatment uses mild air pressure to keep the airways open. A tube that is connected to a motor delivers oxygen to the body.  Ventilator. This treatment moves air into and out of the lungs by using a tube that is placed in your windpipe.  Tracheostomy. This is a procedure to create a hole in the neck so that a breathing tube can be inserted.  Extracorporeal membrane oxygenation (ECMO). This procedure gives the lungs a chance to recover by taking over the functions of the heart and lungs. It supplies oxygen to the body and removes carbon dioxide. Follow these instructions at home: Lifestyle  If you are sick, stay home except to get medical care. Your health care provider will tell you how long to stay home. Call your health care provider before you go for medical care.  Rest at home as told by your health care provider.  Do not use any products that contain nicotine or tobacco, such as cigarettes, e-cigarettes, and  chewing tobacco. If you need help quitting, ask your health care provider.  Return to your normal activities as told by your health care provider. Ask your health care provider what activities are safe for you. General instructions  Take over-the-counter and prescription medicines only as told by your health care provider.  Drink enough fluid to keep your urine pale yellow.  Keep all follow-up visits as told by your health care provider. This is important. How is this prevented?  There is no vaccine to help prevent COVID-19 infection. However, there are steps you can take to protect yourself and others from this virus. To protect yourself:   Do not travel to areas where COVID-19 is a risk. The areas where COVID-19 is reported change often. To identify high-risk areas and travel restrictions, check the CDC travel website: wwwnc.cdc.gov/travel/notices  If you live in, or must travel to, an area where COVID-19 is a risk, take precautions to avoid infection. ? Stay away from people who are sick. ? Wash your hands often with soap and water for 20 seconds. If soap and water   are not available, use an alcohol-based hand sanitizer. ? Avoid touching your mouth, face, eyes, or nose. ? Avoid going out in public, follow guidance from your state and local health authorities. ? If you must go out in public, wear a cloth face covering or face mask. Make sure your mask covers your nose and mouth. ? Avoid crowded indoor spaces. Stay at least 6 feet (2 meters) away from others. ? Disinfect objects and surfaces that are frequently touched every day. This may include:  Counters and tables.  Doorknobs and light switches.  Sinks and faucets.  Electronics, such as phones, remote controls, keyboards, computers, and tablets. To protect others: If you have symptoms of COVID-19, take steps to prevent the virus from spreading to others.  If you think you have a COVID-19 infection, contact your health care  provider right away. Tell your health care team that you think you may have a COVID-19 infection.  Stay home. Leave your house only to seek medical care. Do not use public transport.  Do not travel while you are sick.  Wash your hands often with soap and water for 20 seconds. If soap and water are not available, use alcohol-based hand sanitizer.  Stay away from other members of your household. Let healthy household members care for children and pets, if possible. If you have to care for children or pets, wash your hands often and wear a mask. If possible, stay in your own room, separate from others. Use a different bathroom.  Make sure that all people in your household wash their hands well and often.  Cough or sneeze into a tissue or your sleeve or elbow. Do not cough or sneeze into your hand or into the air.  Wear a cloth face covering or face mask. Make sure your mask covers your nose and mouth. Where to find more information  Centers for Disease Control and Prevention: www.cdc.gov/coronavirus/2019-ncov/index.html  World Health Organization: www.who.int/health-topics/coronavirus Contact a health care provider if:  You live in or have traveled to an area where COVID-19 is a risk and you have symptoms of the infection.  You have had contact with someone who has COVID-19 and you have symptoms of the infection. Get help right away if:  You have trouble breathing.  You have pain or pressure in your chest.  You have confusion.  You have bluish lips and fingernails.  You have difficulty waking from sleep.  You have symptoms that get worse. These symptoms may represent a serious problem that is an emergency. Do not wait to see if the symptoms will go away. Get medical help right away. Call your local emergency services (911 in the [place] Do not drive yourself to the hospital. Let the emergency medical personnel know if you think you have COVID-19. Summary  COVID-19 is a  respiratory infection that is caused by a virus. It is also known as coronavirus disease or novel coronavirus. It can cause serious infections, such as pneumonia, acute respiratory distress syndrome, acute respiratory failure, or sepsis.  The virus that causes COVID-19 is contagious. This means that it can spread from person to person through droplets from breathing, speaking, singing, coughing, or sneezing.  You are more likely to develop a serious illness if you are 50 years of age or older, have a weak immune system, live in a nursing home, or have chronic disease.  There is no medicine to treat COVID-19. Your health care provider will talk with you about ways to treat your symptoms.    Take steps to protect yourself and others from infection. Wash your hands often and disinfect objects and surfaces that are frequently touched every day. Stay away from people who are sick and wear a mask if you are sick. This information is not intended to replace advice given to you by your health care provider. Make sure you discuss any questions you have with your health care provider. Document Revised: 01/21/2019 Document Reviewed: 04/29/2018 Elsevier Patient Education  2020 [employer].  

## 2020-03-14 NOTE — Plan of Care (Signed)
  Problem: Respiratory: Goal: Complications related to the disease process, condition or treatment will be avoided or minimized Outcome: Adequate for Discharge Patient discharging with home O2   Problem: Clinical Measurements: Goal: Respiratory complications will improve Outcome: Adequate for Discharge Discharging with home O2   Problem: Education: Goal: Knowledge of risk factors and measures for prevention of condition will improve Outcome: Completed/Met   Problem: Coping: Goal: Psychosocial and spiritual needs will be supported Outcome: Completed/Met   Problem: Respiratory: Goal: Will maintain a patent airway Outcome: Completed/Met Goal: Complications related to the disease process, condition or treatment will be avoided or minimized Outcome: Completed/Met   Problem: Education: Goal: Knowledge of risk factors and measures for prevention of condition will improve Outcome: Completed/Met   Problem: Coping: Goal: Psychosocial and spiritual needs will be supported Outcome: Completed/Met   Problem: Respiratory: Goal: Will maintain a patent airway Outcome: Completed/Met   Problem: Education: Goal: Knowledge of General Education information will improve Description: Including pain rating scale, medication(s)/side effects and non-pharmacologic comfort measures Outcome: Completed/Met   Problem: Health Behavior/Discharge Planning: Goal: Ability to manage health-related needs will improve Outcome: Completed/Met   Problem: Clinical Measurements: Goal: Ability to maintain clinical measurements within normal limits will improve Outcome: Completed/Met Goal: Will remain free from infection Outcome: Completed/Met Goal: Diagnostic test results will improve Outcome: Completed/Met Goal: Cardiovascular complication will be avoided Outcome: Completed/Met   Problem: Activity: Goal: Risk for activity intolerance will decrease Outcome: Completed/Met   Problem: Nutrition: Goal:  Adequate nutrition will be maintained Outcome: Completed/Met   Problem: Coping: Goal: Level of anxiety will decrease Outcome: Completed/Met   Problem: Elimination: Goal: Will not experience complications related to bowel motility Outcome: Completed/Met Goal: Will not experience complications related to urinary retention Outcome: Completed/Met   Problem: Pain Managment: Goal: General experience of comfort will improve Outcome: Completed/Met   Problem: Safety: Goal: Ability to remain free from injury will improve Outcome: Completed/Met   Problem: Skin Integrity: Goal: Risk for impaired skin integrity will decrease Outcome: Completed/Met

## 2020-03-16 DIAGNOSIS — K5900 Constipation, unspecified: Secondary | ICD-10-CM | POA: Diagnosis not present

## 2020-03-16 DIAGNOSIS — H409 Unspecified glaucoma: Secondary | ICD-10-CM | POA: Diagnosis not present

## 2020-03-16 DIAGNOSIS — J1282 Pneumonia due to coronavirus disease 2019: Secondary | ICD-10-CM | POA: Diagnosis not present

## 2020-03-16 DIAGNOSIS — G4733 Obstructive sleep apnea (adult) (pediatric): Secondary | ICD-10-CM | POA: Diagnosis not present

## 2020-03-16 DIAGNOSIS — J9601 Acute respiratory failure with hypoxia: Secondary | ICD-10-CM | POA: Diagnosis not present

## 2020-03-16 DIAGNOSIS — Z8541 Personal history of malignant neoplasm of cervix uteri: Secondary | ICD-10-CM | POA: Diagnosis not present

## 2020-03-16 DIAGNOSIS — R110 Nausea: Secondary | ICD-10-CM | POA: Diagnosis not present

## 2020-03-16 DIAGNOSIS — U071 COVID-19: Secondary | ICD-10-CM | POA: Diagnosis not present

## 2020-03-16 DIAGNOSIS — E039 Hypothyroidism, unspecified: Secondary | ICD-10-CM | POA: Diagnosis not present

## 2020-03-16 DIAGNOSIS — E785 Hyperlipidemia, unspecified: Secondary | ICD-10-CM | POA: Diagnosis not present

## 2020-03-16 DIAGNOSIS — F319 Bipolar disorder, unspecified: Secondary | ICD-10-CM | POA: Diagnosis not present

## 2020-03-16 DIAGNOSIS — M1990 Unspecified osteoarthritis, unspecified site: Secondary | ICD-10-CM | POA: Diagnosis not present

## 2020-03-21 DIAGNOSIS — J9601 Acute respiratory failure with hypoxia: Secondary | ICD-10-CM | POA: Diagnosis not present

## 2020-03-26 DIAGNOSIS — U071 COVID-19: Secondary | ICD-10-CM | POA: Diagnosis not present

## 2020-03-26 DIAGNOSIS — K5904 Chronic idiopathic constipation: Secondary | ICD-10-CM | POA: Diagnosis not present

## 2020-03-26 DIAGNOSIS — J80 Acute respiratory distress syndrome: Secondary | ICD-10-CM | POA: Diagnosis not present

## 2020-03-26 DIAGNOSIS — F3132 Bipolar disorder, current episode depressed, moderate: Secondary | ICD-10-CM | POA: Diagnosis not present

## 2020-04-03 DIAGNOSIS — E559 Vitamin D deficiency, unspecified: Secondary | ICD-10-CM | POA: Diagnosis not present

## 2020-04-03 DIAGNOSIS — R739 Hyperglycemia, unspecified: Secondary | ICD-10-CM | POA: Diagnosis not present

## 2020-04-03 DIAGNOSIS — E039 Hypothyroidism, unspecified: Secondary | ICD-10-CM | POA: Diagnosis not present

## 2020-04-03 DIAGNOSIS — U071 COVID-19: Secondary | ICD-10-CM | POA: Diagnosis not present

## 2020-04-03 DIAGNOSIS — Z23 Encounter for immunization: Secondary | ICD-10-CM | POA: Diagnosis not present

## 2020-04-03 DIAGNOSIS — E871 Hypo-osmolality and hyponatremia: Secondary | ICD-10-CM | POA: Diagnosis not present

## 2020-04-03 DIAGNOSIS — Z79899 Other long term (current) drug therapy: Secondary | ICD-10-CM | POA: Diagnosis not present

## 2020-04-21 DIAGNOSIS — J9601 Acute respiratory failure with hypoxia: Secondary | ICD-10-CM | POA: Diagnosis not present

## 2020-05-03 DIAGNOSIS — Z0000 Encounter for general adult medical examination without abnormal findings: Secondary | ICD-10-CM | POA: Diagnosis not present

## 2020-05-03 DIAGNOSIS — M1990 Unspecified osteoarthritis, unspecified site: Secondary | ICD-10-CM | POA: Diagnosis not present

## 2020-05-03 DIAGNOSIS — F3132 Bipolar disorder, current episode depressed, moderate: Secondary | ICD-10-CM | POA: Diagnosis not present

## 2020-05-03 DIAGNOSIS — E669 Obesity, unspecified: Secondary | ICD-10-CM | POA: Diagnosis not present

## 2020-05-22 DIAGNOSIS — J9601 Acute respiratory failure with hypoxia: Secondary | ICD-10-CM | POA: Diagnosis not present

## 2020-05-24 DIAGNOSIS — H6122 Impacted cerumen, left ear: Secondary | ICD-10-CM | POA: Diagnosis not present

## 2020-06-19 DIAGNOSIS — J9601 Acute respiratory failure with hypoxia: Secondary | ICD-10-CM | POA: Diagnosis not present

## 2020-07-20 DIAGNOSIS — J9601 Acute respiratory failure with hypoxia: Secondary | ICD-10-CM | POA: Diagnosis not present

## 2020-07-28 DIAGNOSIS — R350 Frequency of micturition: Secondary | ICD-10-CM | POA: Diagnosis not present

## 2020-07-28 DIAGNOSIS — N3000 Acute cystitis without hematuria: Secondary | ICD-10-CM | POA: Diagnosis not present

## 2020-08-19 DIAGNOSIS — J9601 Acute respiratory failure with hypoxia: Secondary | ICD-10-CM | POA: Diagnosis not present

## 2020-08-28 DIAGNOSIS — Z79899 Other long term (current) drug therapy: Secondary | ICD-10-CM | POA: Diagnosis not present

## 2020-08-28 DIAGNOSIS — E039 Hypothyroidism, unspecified: Secondary | ICD-10-CM | POA: Diagnosis not present

## 2020-08-28 DIAGNOSIS — R739 Hyperglycemia, unspecified: Secondary | ICD-10-CM | POA: Diagnosis not present

## 2020-08-28 DIAGNOSIS — F3132 Bipolar disorder, current episode depressed, moderate: Secondary | ICD-10-CM | POA: Diagnosis not present

## 2020-08-28 DIAGNOSIS — E785 Hyperlipidemia, unspecified: Secondary | ICD-10-CM | POA: Diagnosis not present

## 2020-08-28 DIAGNOSIS — K769 Liver disease, unspecified: Secondary | ICD-10-CM | POA: Diagnosis not present

## 2020-09-10 DIAGNOSIS — Z5181 Encounter for therapeutic drug level monitoring: Secondary | ICD-10-CM | POA: Diagnosis not present

## 2020-09-10 DIAGNOSIS — Z28310 Unvaccinated for covid-19: Secondary | ICD-10-CM | POA: Diagnosis not present

## 2020-09-10 DIAGNOSIS — R739 Hyperglycemia, unspecified: Secondary | ICD-10-CM | POA: Diagnosis not present

## 2020-09-10 DIAGNOSIS — E039 Hypothyroidism, unspecified: Secondary | ICD-10-CM | POA: Diagnosis not present

## 2020-09-10 DIAGNOSIS — E785 Hyperlipidemia, unspecified: Secondary | ICD-10-CM | POA: Diagnosis not present

## 2020-09-10 DIAGNOSIS — Z79899 Other long term (current) drug therapy: Secondary | ICD-10-CM | POA: Diagnosis not present

## 2020-09-10 DIAGNOSIS — K7689 Other specified diseases of liver: Secondary | ICD-10-CM | POA: Diagnosis not present

## 2020-09-10 DIAGNOSIS — N952 Postmenopausal atrophic vaginitis: Secondary | ICD-10-CM | POA: Diagnosis not present

## 2020-09-10 DIAGNOSIS — F3132 Bipolar disorder, current episode depressed, moderate: Secondary | ICD-10-CM | POA: Diagnosis not present

## 2020-10-12 DIAGNOSIS — K449 Diaphragmatic hernia without obstruction or gangrene: Secondary | ICD-10-CM | POA: Diagnosis not present

## 2020-10-12 DIAGNOSIS — K769 Liver disease, unspecified: Secondary | ICD-10-CM | POA: Diagnosis not present

## 2020-10-12 DIAGNOSIS — K5710 Diverticulosis of small intestine without perforation or abscess without bleeding: Secondary | ICD-10-CM | POA: Diagnosis not present

## 2021-01-04 DIAGNOSIS — Z23 Encounter for immunization: Secondary | ICD-10-CM | POA: Diagnosis not present

## 2021-01-04 DIAGNOSIS — F3132 Bipolar disorder, current episode depressed, moderate: Secondary | ICD-10-CM | POA: Diagnosis not present

## 2021-01-04 DIAGNOSIS — R32 Unspecified urinary incontinence: Secondary | ICD-10-CM | POA: Diagnosis not present

## 2021-01-04 DIAGNOSIS — R739 Hyperglycemia, unspecified: Secondary | ICD-10-CM | POA: Diagnosis not present

## 2021-01-04 DIAGNOSIS — E785 Hyperlipidemia, unspecified: Secondary | ICD-10-CM | POA: Diagnosis not present

## 2021-01-04 DIAGNOSIS — E039 Hypothyroidism, unspecified: Secondary | ICD-10-CM | POA: Diagnosis not present

## 2021-01-04 DIAGNOSIS — Z79899 Other long term (current) drug therapy: Secondary | ICD-10-CM | POA: Diagnosis not present

## 2021-01-31 DIAGNOSIS — H401123 Primary open-angle glaucoma, left eye, severe stage: Secondary | ICD-10-CM | POA: Diagnosis not present

## 2021-01-31 DIAGNOSIS — H2513 Age-related nuclear cataract, bilateral: Secondary | ICD-10-CM | POA: Diagnosis not present

## 2021-01-31 DIAGNOSIS — H401112 Primary open-angle glaucoma, right eye, moderate stage: Secondary | ICD-10-CM | POA: Diagnosis not present

## 2021-03-13 DIAGNOSIS — H401123 Primary open-angle glaucoma, left eye, severe stage: Secondary | ICD-10-CM | POA: Diagnosis not present

## 2021-03-13 DIAGNOSIS — H401112 Primary open-angle glaucoma, right eye, moderate stage: Secondary | ICD-10-CM | POA: Diagnosis not present

## 2021-03-13 DIAGNOSIS — H2513 Age-related nuclear cataract, bilateral: Secondary | ICD-10-CM | POA: Diagnosis not present

## 2021-06-03 DIAGNOSIS — E039 Hypothyroidism, unspecified: Secondary | ICD-10-CM | POA: Diagnosis not present

## 2021-06-03 DIAGNOSIS — R739 Hyperglycemia, unspecified: Secondary | ICD-10-CM | POA: Diagnosis not present

## 2021-06-03 DIAGNOSIS — E785 Hyperlipidemia, unspecified: Secondary | ICD-10-CM | POA: Diagnosis not present

## 2021-06-03 DIAGNOSIS — F3132 Bipolar disorder, current episode depressed, moderate: Secondary | ICD-10-CM | POA: Diagnosis not present

## 2021-06-03 DIAGNOSIS — Z79899 Other long term (current) drug therapy: Secondary | ICD-10-CM | POA: Diagnosis not present

## 2021-07-19 DIAGNOSIS — Z0000 Encounter for general adult medical examination without abnormal findings: Secondary | ICD-10-CM | POA: Diagnosis not present

## 2021-07-19 DIAGNOSIS — E785 Hyperlipidemia, unspecified: Secondary | ICD-10-CM | POA: Diagnosis not present

## 2021-07-19 DIAGNOSIS — F3132 Bipolar disorder, current episode depressed, moderate: Secondary | ICD-10-CM | POA: Diagnosis not present

## 2021-07-19 DIAGNOSIS — M533 Sacrococcygeal disorders, not elsewhere classified: Secondary | ICD-10-CM | POA: Diagnosis not present

## 2021-08-21 DIAGNOSIS — F3132 Bipolar disorder, current episode depressed, moderate: Secondary | ICD-10-CM | POA: Diagnosis not present

## 2021-08-21 DIAGNOSIS — R413 Other amnesia: Secondary | ICD-10-CM | POA: Diagnosis not present

## 2021-08-22 DIAGNOSIS — N289 Disorder of kidney and ureter, unspecified: Secondary | ICD-10-CM | POA: Diagnosis not present

## 2021-10-09 DIAGNOSIS — H2513 Age-related nuclear cataract, bilateral: Secondary | ICD-10-CM | POA: Diagnosis not present

## 2021-10-09 DIAGNOSIS — H401123 Primary open-angle glaucoma, left eye, severe stage: Secondary | ICD-10-CM | POA: Diagnosis not present

## 2021-10-09 DIAGNOSIS — H401112 Primary open-angle glaucoma, right eye, moderate stage: Secondary | ICD-10-CM | POA: Diagnosis not present

## 2021-10-21 DIAGNOSIS — E559 Vitamin D deficiency, unspecified: Secondary | ICD-10-CM | POA: Diagnosis not present

## 2021-10-21 DIAGNOSIS — R739 Hyperglycemia, unspecified: Secondary | ICD-10-CM | POA: Diagnosis not present

## 2021-10-21 DIAGNOSIS — E039 Hypothyroidism, unspecified: Secondary | ICD-10-CM | POA: Diagnosis not present

## 2021-10-21 DIAGNOSIS — E785 Hyperlipidemia, unspecified: Secondary | ICD-10-CM | POA: Diagnosis not present

## 2021-10-21 DIAGNOSIS — F3112 Bipolar disorder, current episode manic without psychotic features, moderate: Secondary | ICD-10-CM | POA: Diagnosis not present

## 2021-12-22 IMAGING — CT CT ANGIO CHEST
2 of 6 series · 18 of 46 positions shown · IV contrast (APPLIED)
Comparison: 10/14/2011

CLINICAL DATA: Shortness of breath

EXAM:
CT ANGIOGRAPHY CHEST WITH CONTRAST
TECHNIQUE: Multidetector CT imaging of the chest was performed using the
standard protocol during bolus administration of intravenous
contrast. Multiplanar CT image reconstructions and MIPs were
obtained to evaluate the vascular anatomy.
CONTRAST:  100mL OMNIPAQUE IOHEXOL 350 MG/ML SOLN

[Series 5: thins · axial · 0.67mm/px · z∈[-374,-144]mm · 16 of 252 slices shown]
[im 11/252  lung]
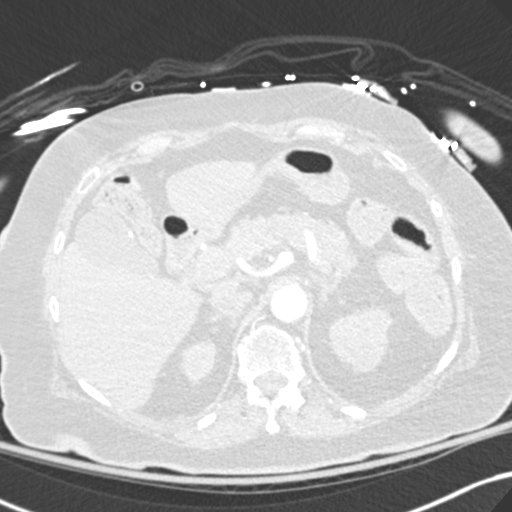
[im 33/252  soft-tissue]
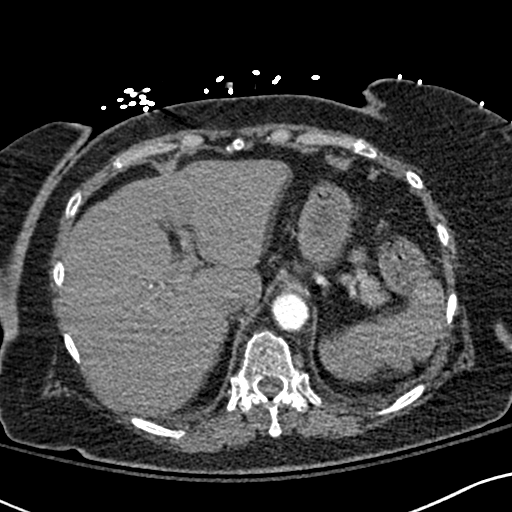
[im 44/252  lung]
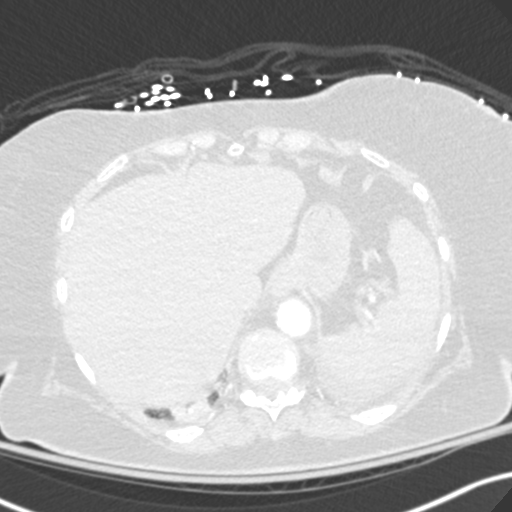
[im 55/252  soft-tissue]
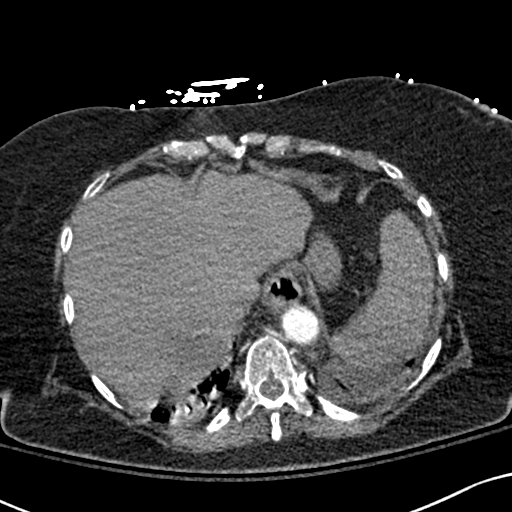
[im 77/252  lung]
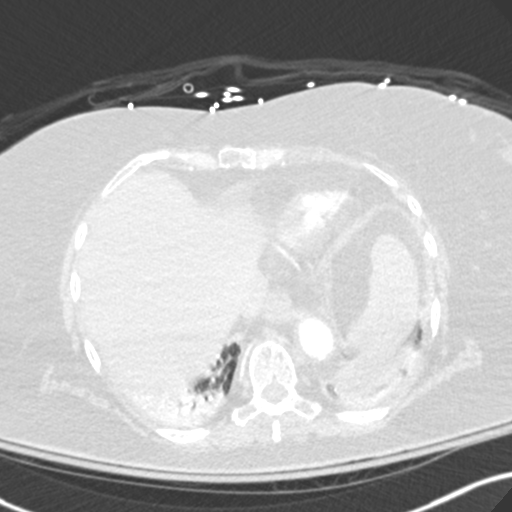
[im 88/252  soft-tissue]
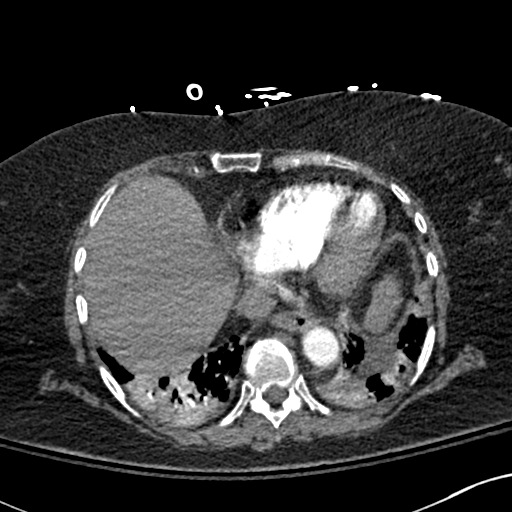
[im 99/252  lung]
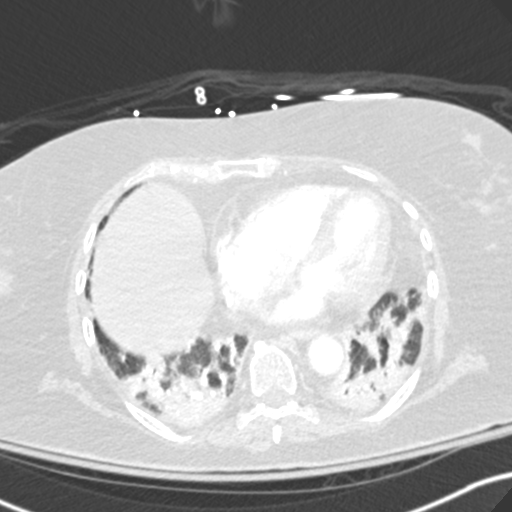
[im 121/252  soft-tissue]
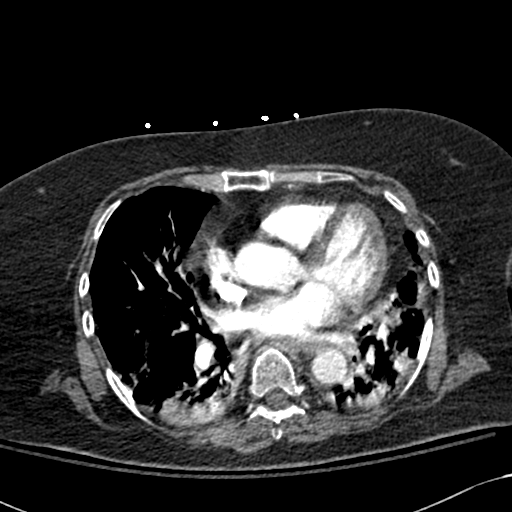
[im 131/252  lung]
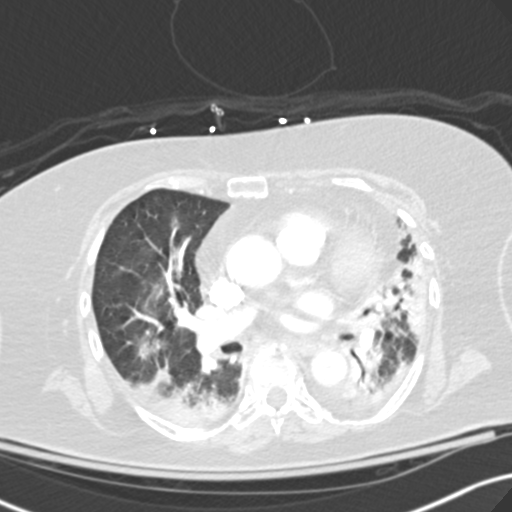
[im 153/252  soft-tissue]
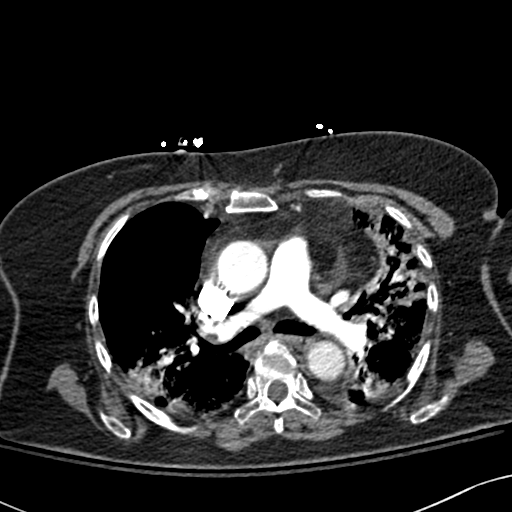
[im 164/252  lung]
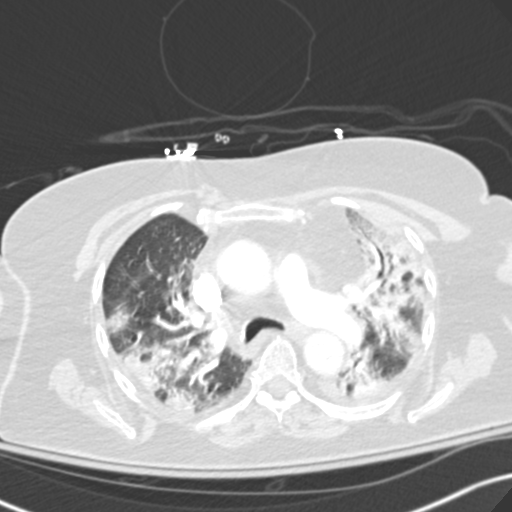
[im 175/252  soft-tissue]
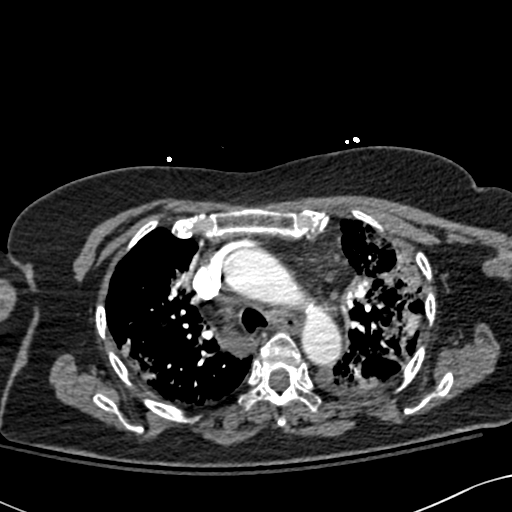
[im 197/252  lung]
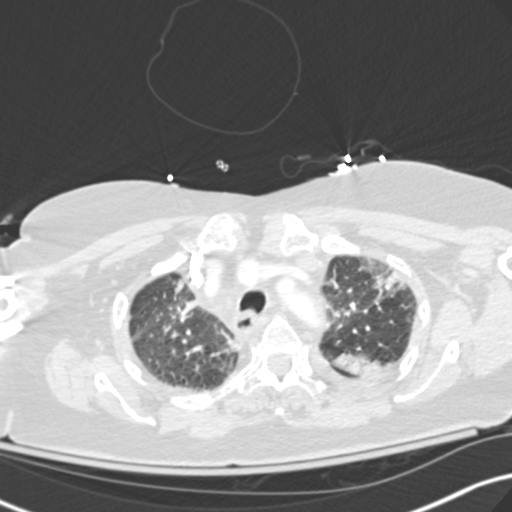
[im 208/252  soft-tissue]
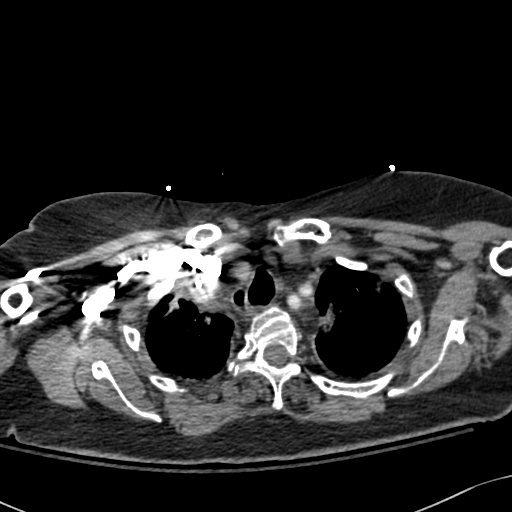
[im 219/252  lung]
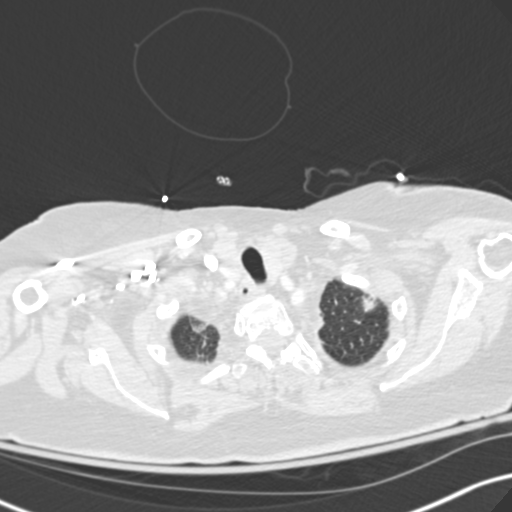
[im 241/252  soft-tissue]
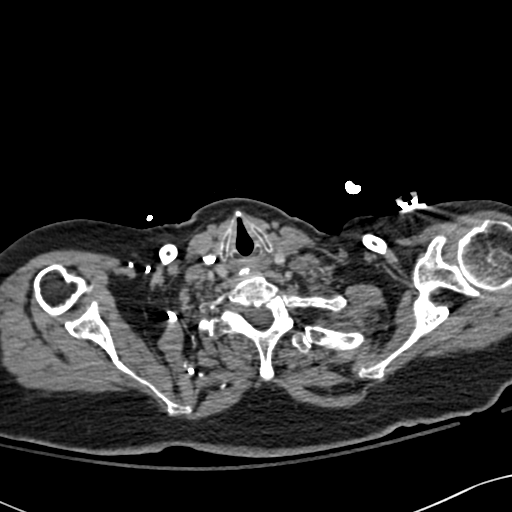

[Series 7: coronal mpr · coronal · 0.51mm/px · 2 of 88 slices shown]
[im 30/88  soft-tissue]
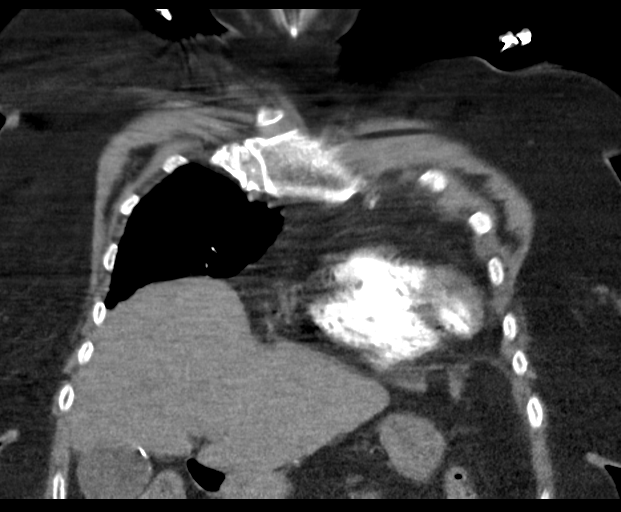
[im 59/88  soft-tissue]
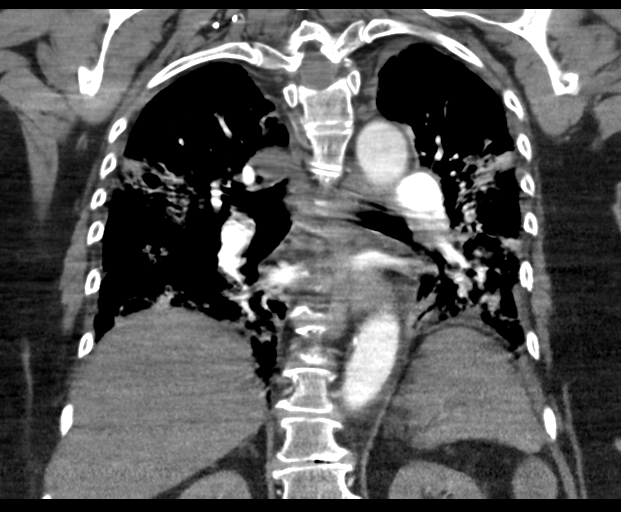

[18 of 46 positions shown; findings below may reference images not displayed]

FINDINGS: Cardiovascular: Contrast injection is sufficient to demonstrate
satisfactory opacification of the pulmonary arteries to the
segmental level. There is no pulmonary embolus or evidence of right
heart strain. The size of the main pulmonary artery is normal. Heart
size is normal, with no pericardial effusion. The course and caliber
of the aorta are normal. There is mild atherosclerotic
calcification. Opacification decreased due to pulmonary arterial
phase contrast bolus timing.

Mediastinum/Nodes: No mediastinal, hilar or axillary
lymphadenopathy. Normal visualized thyroid.

Lungs/Pleura: Multifocal, peripheral predominant ground glass
opacities. No pleural effusion.

Upper Abdomen: Contrast bolus timing is not optimized for evaluation
of the abdominal organs. The visualized portions of the organs of
the upper abdomen are normal.

Musculoskeletal: No chest wall abnormality. No bony spinal canal
stenosis.

Review of the MIP images confirms the above findings.
IMPRESSION: 1. No pulmonary embolus or acute aortic syndrome.
2. Multifocal, peripheral predominant ground glass opacities, most
consistent with multifocal pneumonia.

Aortic Atherosclerosis ([id number]).

## 2021-12-24 DIAGNOSIS — Z23 Encounter for immunization: Secondary | ICD-10-CM | POA: Diagnosis not present

## 2021-12-24 DIAGNOSIS — F3132 Bipolar disorder, current episode depressed, moderate: Secondary | ICD-10-CM | POA: Diagnosis not present

## 2021-12-24 DIAGNOSIS — R32 Unspecified urinary incontinence: Secondary | ICD-10-CM | POA: Diagnosis not present

## 2022-01-28 DIAGNOSIS — E785 Hyperlipidemia, unspecified: Secondary | ICD-10-CM | POA: Diagnosis not present

## 2022-01-28 DIAGNOSIS — R739 Hyperglycemia, unspecified: Secondary | ICD-10-CM | POA: Diagnosis not present

## 2022-01-28 DIAGNOSIS — F3132 Bipolar disorder, current episode depressed, moderate: Secondary | ICD-10-CM | POA: Diagnosis not present

## 2022-01-28 DIAGNOSIS — Z79899 Other long term (current) drug therapy: Secondary | ICD-10-CM | POA: Diagnosis not present

## 2022-01-28 DIAGNOSIS — E039 Hypothyroidism, unspecified: Secondary | ICD-10-CM | POA: Diagnosis not present

## 2022-07-11 ENCOUNTER — Emergency Department (HOSPITAL_COMMUNITY): Payer: Medicare Other

## 2022-07-11 ENCOUNTER — Emergency Department (HOSPITAL_COMMUNITY)
Admission: EM | Admit: 2022-07-11 | Discharge: 2022-07-11 | Disposition: A | Discharge status: Home / Self Care | Payer: Medicare Other | Attending: Emergency Medicine | Admitting: Emergency Medicine

## 2022-07-11 DIAGNOSIS — T40715A Adverse effect of cannabis, initial encounter: Secondary | ICD-10-CM | POA: Insufficient documentation

## 2022-07-11 DIAGNOSIS — R519 Headache, unspecified: Secondary | ICD-10-CM | POA: Insufficient documentation

## 2022-07-11 DIAGNOSIS — R4182 Altered mental status, unspecified: Secondary | ICD-10-CM | POA: Diagnosis present

## 2022-07-11 DIAGNOSIS — T50905A Adverse effect of unspecified drugs, medicaments and biological substances, initial encounter: Secondary | ICD-10-CM

## 2022-07-11 LAB — CBC WITH DIFFERENTIAL/PLATELET
Abs Immature Granulocytes: 0.16 10*3/uL — ABNORMAL HIGH (ref 0.00–0.07)
Basophils Absolute: 0.1 10*3/uL (ref 0.0–0.1)
Basophils Relative: 1 %
Eosinophils Absolute: 0.1 10*3/uL (ref 0.0–0.5)
Eosinophils Relative: 0 %
HCT: 38.9 % (ref 36.0–46.0)
Hemoglobin: 12.6 g/dL (ref 12.0–15.0)
Immature Granulocytes: 1 %
Lymphocytes Relative: 10 %
Lymphs Abs: 1.4 10*3/uL (ref 0.7–4.0)
MCH: 32.2 pg (ref 26.0–34.0)
MCHC: 32.4 g/dL (ref 30.0–36.0)
MCV: 99.5 fL (ref 80.0–100.0)
Monocytes Absolute: 1 10*3/uL (ref 0.1–1.0)
Monocytes Relative: 7 %
Neutro Abs: 10.9 10*3/uL — ABNORMAL HIGH (ref 1.7–7.7)
Neutrophils Relative %: 81 %
Platelets: 260 10*3/uL (ref 150–400)
RBC: 3.91 MIL/uL (ref 3.87–5.11)
RDW: 14.7 % (ref 11.5–15.5)
WBC: 13.6 10*3/uL — ABNORMAL HIGH (ref 4.0–10.5)
nRBC: 0 % (ref 0.0–0.2)

## 2022-07-11 LAB — I-STAT VENOUS BLOOD GAS, ED
Acid-base deficit: 2 mmol/L (ref 0.0–2.0)
Bicarbonate: 24.3 mmol/L (ref 20.0–28.0)
Calcium, Ion: 1.22 mmol/L (ref 1.15–1.40)
HCT: 38 % (ref 36.0–46.0)
Hemoglobin: 12.9 g/dL (ref 12.0–15.0)
O2 Saturation: 58 %
Potassium: 4.8 mmol/L (ref 3.5–5.1)
Sodium: 136 mmol/L (ref 135–145)
TCO2: 26 mmol/L (ref 22–32)
pCO2, Ven: 45 mmHg (ref 44–60)
pH, Ven: 7.34 (ref 7.25–7.43)
pO2, Ven: 32 mmHg (ref 32–45)

## 2022-07-11 LAB — COMPREHENSIVE METABOLIC PANEL
ALT: 11 U/L (ref 0–44)
AST: 15 U/L (ref 15–41)
Albumin: 3.3 g/dL — ABNORMAL LOW (ref 3.5–5.0)
Alkaline Phosphatase: 67 U/L (ref 38–126)
Anion gap: 5 (ref 5–15)
BUN: 15 mg/dL (ref 8–23)
CO2: 24 mmol/L (ref 22–32)
Calcium: 8.8 mg/dL — ABNORMAL LOW (ref 8.9–10.3)
Chloride: 107 mmol/L (ref 98–111)
Creatinine, Ser: 1.01 mg/dL — ABNORMAL HIGH (ref 0.44–1.00)
GFR, Estimated: 55 mL/min — ABNORMAL LOW (ref >60)
Glucose, Bld: 106 mg/dL — ABNORMAL HIGH (ref 70–99)
Potassium: 4.8 mmol/L (ref 3.5–5.1)
Sodium: 136 mmol/L (ref 135–145)
Total Bilirubin: 0.6 mg/dL (ref 0.3–1.2)
Total Protein: 5.8 g/dL — ABNORMAL LOW (ref 6.5–8.1)

## 2022-07-11 LAB — AMMONIA: Ammonia: 30 umol/L (ref 9–35)

## 2022-07-11 LAB — CBG MONITORING, ED: Glucose-Capillary: 120 mg/dL — ABNORMAL HIGH (ref 70–99)

## 2022-07-11 LAB — ETHANOL: Alcohol, Ethyl (B): <10 mg/dL (ref <10)

## 2022-07-11 LAB — LITHIUM LEVEL: Lithium Lvl: 0.62 mmol/L (ref 0.60–1.20)

## 2022-07-11 MED ORDER — SODIUM CHLORIDE 0.9 % IV SOLN: INTRAVENOUS | Status: DC

## 2022-07-11 MED ORDER — SODIUM CHLORIDE 0.9 % IV BOLUS
500.0000 mL | Freq: Once | INTRAVENOUS | Status: AC
  Administered 2022-07-11: 500 mL via INTRAVENOUS

## 2022-07-11 NOTE — Discharge Instructions (Addendum)
Avoid the CBD medication.  Follow up with her orthopedic doctor and I have included the office information for pace as you requested.

## 2022-07-11 NOTE — ED Notes (Signed)
Pt eating sandwich/ MD at bedside

## 2022-07-11 NOTE — ED Notes (Signed)
MD states, urine sample is not needed at this time

## 2022-07-11 NOTE — ED Provider Notes (Signed)
[facility] [place] [facility]   CSN: [id number] Arrival date & time: 07/11/22  1428     History  Chief Complaint  Patient presents with   Altered Mental Status    Laura Drake is a 82 y.o. female.   Altered Mental Status    Patient has a history of depression hyperlipidemia bipolar disorder who presents to the ED for evaluation of altered mental status.  According to the EMS report.  Patient took delta 915 mg and CBD 250 mg today.  Patient was found on the floor in the bathroom by family.  Patient herself is not exactly able to tell me what happened.  She states she was at home today and did take something.  She says that she thought it would be interesting.  She cannot tell me specifically what she took.  Patient is not sure how she got here.  She denies any complaints right now of headache.  No chest pain.  No abdominal pain.  No fevers.  Daughter was present at the bedside.  She states that patient was given a combination of delta 9 and CBD by family members earlier today.  Pt has been having trouble with her knee and was giving it to her for pain control. Previously she had taken half a tablet was fine.  She did take the full tablet today.    Home Medications Prior to Admission medications   Medication Sig Start Date End Date Taking? Authorizing Provider  FLUoxetine (PROZAC) 20 MG capsule Take 20 mg by mouth daily.  06/05/17   [provider]  levothyroxine (EUTHYROX) 50 MCG tablet Take 50 mcg by mouth daily. 12/31/19   [provider]  lithium (LITHOBID) 300 MG CR tablet Take 300 mg by mouth daily.      [provider]  predniSONE (DELTASONE) 10 MG tablet TAke 4 tablets for 1 days, then 3 tablets for 1 days, then 2 tabs for 1 days, then 1 tab for 1 days, and then stop. 03/13/20   Marinda ElkFeliz Ortiz, Abraham, MD      Allergies    Amitiza [lubiprostone], Other, Sulfa antibiotics, and Tramadol    Review of Systems    Review of Systems  Physical Exam Updated Vital Signs BP (!) 122/51 (BP Location: Left Arm)   Pulse 88   Temp 98.3 F (36.8 C) (Oral)   Resp 16   SpO2 99%  Physical Exam Vitals and nursing note reviewed.  Constitutional:      General: She is not in acute distress.    Appearance: She is well-developed.  HENT:     Head: Normocephalic and atraumatic.     Right Ear: External ear normal.     Left Ear: External ear normal.  Eyes:     General: No scleral icterus.       Right eye: No discharge.        Left eye: No discharge.     Conjunctiva/sclera: Conjunctivae normal.  Neck:     Trachea: No tracheal deviation.  Cardiovascular:     Rate and Rhythm: Normal rate and regular rhythm.  Pulmonary:     Effort: Pulmonary effort is normal. No respiratory distress.     Breath sounds: Normal breath sounds. No stridor. No wheezing or rales.  Abdominal:     General: Bowel sounds are normal. There is no distension.     Palpations: Abdomen is soft.     Tenderness: There is no abdominal tenderness. There is  no guarding or rebound.  Musculoskeletal:        General: No tenderness or deformity.     Cervical back: Neck supple.  Skin:    General: Skin is warm and dry.     Findings: No rash.  Neurological:     General: No focal deficit present.     Mental Status: She is alert.     GCS: GCS eye subscore is 4. GCS verbal subscore is 4. GCS motor subscore is 6.     Cranial Nerves: No cranial nerve deficit, dysarthria or facial asymmetry.     Sensory: No sensory deficit.     Motor: No abnormal muscle tone or seizure activity.     Coordination: Coordination normal.     Comments: Patient confused, able able to tell me the date, is able to follow commands  Psychiatric:        Mood and Affect: Mood normal.     ED Results / Procedures / Treatments   Labs (all labs ordered are listed, but only abnormal results are displayed) Labs Reviewed  COMPREHENSIVE METABOLIC PANEL - Abnormal; Notable for the  following components:      Result Value   Glucose, Bld 106 (*)    Creatinine, Ser 1.01 (*)    Calcium 8.8 (*)    Total Protein 5.8 (*)    Albumin 3.3 (*)    GFR, Estimated 55 (*)    All other components within normal limits  CBC WITH DIFFERENTIAL/PLATELET - Abnormal; Notable for the following components:   WBC 13.6 (*)    Neutro Abs 10.9 (*)    Abs Immature Granulocytes 0.16 (*)    All other components within normal limits  CBG MONITORING, ED - Abnormal; Notable for the following components:   Glucose-Capillary 120 (*)    All other components within normal limits  ETHANOL  LITHIUM LEVEL  AMMONIA  URINALYSIS, ROUTINE W REFLEX MICROSCOPIC  RAPID URINE DRUG SCREEN, HOSP PERFORMED  I-STAT VENOUS BLOOD GAS, ED    EKG EKG Interpretation  Date/Time:  Friday July 11 2022 14:42:59 EDT Ventricular Rate:  74 PR Interval:  185 QRS Duration: 87 QT Interval:  377 QTC Calculation: 419 R Axis:   15 Text Interpretation: Sinus rhythm Low voltage, precordial leads Borderline T abnormalities, anterior leads Since last tracing rate slower Confirmed by Linwood Dibbles [phone]) on 07/11/2022 3:00:45 PM  Radiology DG Chest 1 View  Result Date: 07/11/2022 CLINICAL DATA:  Altered mental status. EXAM: CHEST  1 VIEW COMPARISON:  Chest radiograph dated March 08, 2020 FINDINGS: The heart is enlarged. Low lung volumes without evidence of focal consolidation or pleural effusion. Thoracic spondylosis. Bilateral glenohumeral osteoarthritis. No acute osseous abnormality IMPRESSION: 1. Low lung volumes without evidence of acute cardiopulmonary process. 2. Cardiomegaly. Electronically Signed   By: Larose Hires D.O.   On: 07/11/2022 16:24   CT HEAD WO CONTRAST  Result Date: 07/11/2022 CLINICAL DATA:  Mental status change. Patient was found on the floor of the bathroom and was altered. EXAM: CT HEAD WITHOUT CONTRAST TECHNIQUE: Contiguous axial images were obtained from the base of the skull through the vertex without  intravenous contrast. RADIATION DOSE REDUCTION: This exam was performed according to the departmental dose-optimization program which includes automated exposure control, adjustment of the mA and/or kV according to patient size and/or use of iterative reconstruction technique. COMPARISON:  CT head examination dated September 14, 2011. FINDINGS: Brain: No evidence of acute infarction, hemorrhage, hydrocephalus, extra-axial collection or mass lesion/mass effect. Moderate generalized  cerebral atrophy. Diffuse low-attenuation of the periventricular and subcortical white matter presumed chronic microvascular ischemic changes. Vascular: No hyperdense vessel or unexpected calcification. Skull: Normal. Negative for fracture or focal lesion. Sinuses/Orbits: No acute finding. Other: None. IMPRESSION: 1. No acute intracranial abnormality. 2. Moderate generalized cerebral atrophy and chronic microvascular ischemic changes of the white matter. Electronically Signed   By: Larose HiresImran  Ahmed D.O.   On: 07/11/2022 16:23    Procedures Procedures    Medications Ordered in ED Medications  sodium chloride 0.9 % bolus 500 mL (0 mLs Intravenous Stopped 07/11/22 1737)    And  0.9 %  sodium chloride infusion ( Intravenous New Bag/Given 07/11/22 1737)    ED Course/ Medical Decision Making/ A&P Clinical Course as of 07/11/22 2017  Fri Jul 11, 2022  1659 Head CT shows moderate atrophy without acute findings [JK]  1700 Chest x-ray without acute abnormalities [JK]  1700 CBC with Differential/Platelet(!) White blood cell count elevated [JK]  1700 I-Stat venous blood gas, ED Normal pH [JK]  1937 Ammonia nl [JK]  1937 Lithium level nl [JK]  1938 Mental status has improved.  Daughter states she is back to baseline [JK]    Clinical Course User Index [JK] Linwood DibblesKnapp, Estes Lehner, MD                             Medical Decision Making Differential diagnosis includes but not limited to stroke, metabolic encephalopathy, drug adverse effect,  electrolyte imbalance  Problems Addressed: Medication adverse effect, initial encounter: acute illness or injury that poses a threat to life or bodily functions  Amount and/or Complexity of Data Reviewed Labs: ordered. Decision-making details documented in ED Course. Radiology: ordered and independent interpretation performed.  Risk Prescription drug management.   Patient presented to ED for evaluation of altered mental status after taking delta 9 in CBD.  Family had been giving her that to help manage her knee pain.  Patient has seen an orthopedic doctor previously.  Patient denied any complaints of joint or knee pain while she was in the ED.  Her mental status slowly improved.  She is now alert and awake and eating food.  Daughter states she is at her baseline.  ED workup is reassuring.  CT scan did not show any acute abnormalities.  No signs of lithium toxicity.  No evidence of hepatic encephalopathy.  No signs of significant electrolyte disturbance.  No findings to suggest infection.  I suspect her mental status change was related to the drug ingestion.  Recommend outpatient follow-up with orthopedics and family also asked about trying to see [facility].  I provided their number so they can contact them on Monday.        Final Clinical Impression(s) / ED Diagnoses Final diagnoses:  Medication adverse effect, initial encounter    Rx / DC Orders ED Discharge Orders     None         Linwood DibblesKnapp, Bianca Raneri, MD 07/11/22 2017

## 2022-07-11 NOTE — ED Triage Notes (Addendum)
Pt BIB [facility] from home. EMS reported pt took Delta nine 15 mg and CBD 250 mg. Family reported patient was found on the floor in the bathroom and she was altered. Per family patient is normally A & O x 4. Today she is A & O x 2. BP 133/70, Pulse 70, saturation 98% RA, CBG 149.

## 2022-07-15 ENCOUNTER — Telehealth: Payer: Self-pay

## 2022-07-15 NOTE — Telephone Encounter (Signed)
        Patient  visited [facility] [place] on 07/11/2022  for altered mental status.   Telephone encounter attempt :  1st  A HIPAA compliant voice message was left requesting a return call.  Instructed patient to call back at [phone].   Okla Qazi [facility]  [facility] Population Health Community Resource Care Guide   [email or web address]  ?? [phone]   Website: [email or web address]  [email or web address]

## 2022-07-16 ENCOUNTER — Telehealth: Payer: Self-pay

## 2022-07-16 NOTE — Telephone Encounter (Signed)
        Patient  visited [facility] [place] on 07/11/2022  for altered mental status.   Telephone encounter attempt :  2nd  A HIPAA compliant voice message was left requesting a return call.  Instructed patient to call back at [phone].   Damonte Frieson [facility]  [facility] Population Health Community Resource Care Guide   [email or web address]  ?? [phone]   Website: [email or web address]  [email or web address]

## 2023-03-09 ENCOUNTER — Encounter: Payer: Medicare Other | Admitting: Obstetrics and Gynecology

## 2023-03-09 NOTE — Progress Notes (Unsigned)
    82 y.o. [id number] female here for vulvar irritation. Tried topical premarin and treated for yeast by PCP. However nuswab were negative both in July and September 1024.  No LMP recorded. Patient has had a hysterectomy.    Birth control: *** Sexually active: ***    GYN HISTORY: ***  OB History  Gravida Para Term Preterm AB Living  3 3 3     3   SAB IAB Ectopic Multiple Live Births               # Outcome Date GA Lbr Len/2nd Weight Sex Type Anes PTL Lv  3 Term           2 Term           1 Term             Past Medical History:  Diagnosis Date   Abnormal Pap smear of cervix    Adenomatous polyp of colon 2013   Anxiety    Bipolar affective disorder (HCC)    dx at age 67    Cancer [facility]) 1979   cervical cancer   Depression    h/o suicidal attempt 1987   DJD (degenerative joint disease)    Fracture of leg 1991   L leg, cast x 6 months    Glaucoma    Hernia, inguinal    per CT 3-12   Hyperlipidemia    OSA (obstructive sleep apnea) 09-24-09   + sleep study, "moderate"   S/P colonoscopy 03-13-2000   hemorrhoids, tortous sigmoid, Dr Ewing Schlein    Past Surgical History:  Procedure Laterality Date   RADICAL HYSTERECTOMY  1979    Current Outpatient Medications on File Prior to Visit  Medication Sig Dispense Refill   FLUoxetine (PROZAC) 20 MG capsule Take 20 mg by mouth daily.   3   levothyroxine (EUTHYROX) 50 MCG tablet Take 50 mcg by mouth daily.     lithium (LITHOBID) 300 MG CR tablet Take 300 mg by mouth daily.       predniSONE (DELTASONE) 10 MG tablet TAke 4 tablets for 1 days, then 3 tablets for 1 days, then 2 tabs for 1 days, then 1 tab for 1 days, and then stop. 10 tablet 0   No current facility-administered medications on file prior to visit.    Allergies  Allergen Reactions   Amitiza [Lubiprostone] Diarrhea, Nausea And Vomiting and Other (See Comments)    Bad pains   Other Swelling    Shellfish   Sulfa Antibiotics    Tramadol Rash      PE There were  no vitals filed for this visit. There is no height or weight on file to calculate BMI.  Physical Exam    Assessment and Plan:        There are no diagnoses linked to this encounter.   Rosalyn Gess, MD
